# Patient Record
Sex: Female | Born: 1945
Health system: Southern US, Community
[De-identification: ages and names within clinical notes are randomized; demographics above are authoritative.]

## PROBLEM LIST (undated history)

## (undated) DIAGNOSIS — D122 Benign neoplasm of ascending colon: Secondary | ICD-10-CM

## (undated) DIAGNOSIS — F32A Depression, unspecified: Secondary | ICD-10-CM

## (undated) DIAGNOSIS — E785 Hyperlipidemia, unspecified: Secondary | ICD-10-CM

## (undated) DIAGNOSIS — F329 Major depressive disorder, single episode, unspecified: Secondary | ICD-10-CM

## (undated) DIAGNOSIS — G4700 Insomnia, unspecified: Secondary | ICD-10-CM

## (undated) DIAGNOSIS — N951 Menopausal and female climacteric states: Secondary | ICD-10-CM

## (undated) DIAGNOSIS — I10 Essential (primary) hypertension: Secondary | ICD-10-CM

## (undated) DIAGNOSIS — J449 Chronic obstructive pulmonary disease, unspecified: Secondary | ICD-10-CM

## (undated) DIAGNOSIS — J309 Allergic rhinitis, unspecified: Secondary | ICD-10-CM

## (undated) DIAGNOSIS — M1990 Unspecified osteoarthritis, unspecified site: Secondary | ICD-10-CM

## (undated) DIAGNOSIS — R51 Headache: Secondary | ICD-10-CM

## (undated) DIAGNOSIS — R569 Unspecified convulsions: Secondary | ICD-10-CM

## (undated) DIAGNOSIS — K219 Gastro-esophageal reflux disease without esophagitis: Secondary | ICD-10-CM

## (undated) DIAGNOSIS — N819 Female genital prolapse, unspecified: Secondary | ICD-10-CM

## (undated) DIAGNOSIS — E039 Hypothyroidism, unspecified: Secondary | ICD-10-CM

## (undated) DIAGNOSIS — R519 Headache, unspecified: Secondary | ICD-10-CM

## (undated) DIAGNOSIS — M810 Age-related osteoporosis without current pathological fracture: Secondary | ICD-10-CM

## (undated) DIAGNOSIS — J00 Acute nasopharyngitis [common cold]: Secondary | ICD-10-CM

## (undated) HISTORY — PX: EYE SURGERY: SHX253

## (undated) HISTORY — PX: OTHER SURGICAL HISTORY: SHX169

---

## 2002-05-08 HISTORY — PX: MULTIPLE TOOTH EXTRACTIONS: SHX2053

## 2004-05-05 ENCOUNTER — Ambulatory Visit: Payer: Self-pay | Admitting: Unknown Physician Specialty

## 2005-12-12 ENCOUNTER — Ambulatory Visit: Payer: Self-pay | Admitting: Internal Medicine

## 2007-12-24 ENCOUNTER — Ambulatory Visit: Payer: Self-pay | Admitting: Internal Medicine

## 2009-04-09 ENCOUNTER — Ambulatory Visit: Payer: Self-pay | Admitting: Internal Medicine

## 2010-07-18 ENCOUNTER — Ambulatory Visit: Payer: Self-pay | Admitting: Ophthalmology

## 2011-05-09 HISTORY — PX: CATARACT EXTRACTION W/ INTRAOCULAR LENS IMPLANT: SHX1309

## 2012-06-05 ENCOUNTER — Other Ambulatory Visit: Payer: Self-pay | Admitting: Urology

## 2012-07-09 ENCOUNTER — Encounter (HOSPITAL_BASED_OUTPATIENT_CLINIC_OR_DEPARTMENT_OTHER): Payer: Self-pay | Admitting: *Deleted

## 2012-07-09 NOTE — Progress Notes (Signed)
NPO AFTER MN. ARRIVES AT 0730. NEEDS HG. REVIEWED [facility] GUIDELINES.

## 2012-07-14 NOTE — Anesthesia Preprocedure Evaluation (Addendum)
Anesthesia Evaluation  Patient identified by MRN, date of birth, ID band Patient awake    Reviewed: Allergy & Precautions, H&P , NPO status , Patient's Chart, lab work & pertinent test results, reviewed documented beta blocker date and time   Airway Mallampati: II TM Distance: >3 FB Neck ROM: full    Dental  (+) Upper Dentures, Loose and Dental Advisory Given Loose lower front teeth.:   Pulmonary Current Smoker,  breath sounds clear to auscultation  Pulmonary exam normal       Cardiovascular Exercise Tolerance: Good negative cardio ROS  Rhythm:regular Rate:Normal     Neuro/Psych negative neurological ROS  negative psych ROS   GI/Hepatic negative GI ROS, Neg liver ROS,   Endo/Other  negative endocrine ROS  Renal/GU negative Renal ROS  negative genitourinary   Musculoskeletal   Abdominal   Peds  Hematology negative hematology ROS (+)   Anesthesia Other Findings   Reproductive/Obstetrics negative OB ROS                          Anesthesia Physical Anesthesia Plan  ASA: II  Anesthesia Plan: General LMA   Post-op Pain Management:    Induction:   Airway Management Planned:   Additional Equipment:   Intra-op Plan:   Post-operative Plan:   Informed Consent: I have reviewed the patients History and Physical, chart, labs and discussed the procedure including the risks, benefits and alternatives for the proposed anesthesia with the patient or authorized representative who has indicated his/her understanding and acceptance.   Dental Advisory Given  Plan Discussed with: CRNA  Anesthesia Plan Comments:         Anesthesia Quick Evaluation

## 2012-07-15 ENCOUNTER — Encounter (HOSPITAL_BASED_OUTPATIENT_CLINIC_OR_DEPARTMENT_OTHER): Payer: Self-pay | Admitting: Anesthesiology

## 2012-07-15 ENCOUNTER — Encounter (HOSPITAL_BASED_OUTPATIENT_CLINIC_OR_DEPARTMENT_OTHER)
Admission: RE | Disposition: A | Discharge status: Home / Self Care | Payer: Self-pay | Source: Ambulatory Visit | Attending: Urology

## 2012-07-15 ENCOUNTER — Ambulatory Visit (HOSPITAL_BASED_OUTPATIENT_CLINIC_OR_DEPARTMENT_OTHER)
Admission: RE | Admit: 2012-07-15 | Discharge: 2012-07-16 | Disposition: A | Discharge status: Home / Self Care | Payer: Medicare Other | Source: Ambulatory Visit | Attending: Urology | Admitting: Urology

## 2012-07-15 ENCOUNTER — Ambulatory Visit (HOSPITAL_BASED_OUTPATIENT_CLINIC_OR_DEPARTMENT_OTHER): Payer: Medicare Other | Admitting: Anesthesiology

## 2012-07-15 DIAGNOSIS — E7800 Pure hypercholesterolemia, unspecified: Secondary | ICD-10-CM | POA: Insufficient documentation

## 2012-07-15 DIAGNOSIS — N8111 Cystocele, midline: Secondary | ICD-10-CM | POA: Insufficient documentation

## 2012-07-15 DIAGNOSIS — F17200 Nicotine dependence, unspecified, uncomplicated: Secondary | ICD-10-CM | POA: Insufficient documentation

## 2012-07-15 DIAGNOSIS — J4489 Other specified chronic obstructive pulmonary disease: Secondary | ICD-10-CM | POA: Insufficient documentation

## 2012-07-15 DIAGNOSIS — Z7982 Long term (current) use of aspirin: Secondary | ICD-10-CM | POA: Insufficient documentation

## 2012-07-15 DIAGNOSIS — K5900 Constipation, unspecified: Secondary | ICD-10-CM | POA: Insufficient documentation

## 2012-07-15 DIAGNOSIS — N3946 Mixed incontinence: Secondary | ICD-10-CM | POA: Insufficient documentation

## 2012-07-15 DIAGNOSIS — Z79899 Other long term (current) drug therapy: Secondary | ICD-10-CM | POA: Insufficient documentation

## 2012-07-15 HISTORY — DX: Acute nasopharyngitis (common cold): J00

## 2012-07-15 HISTORY — PX: CYSTOSCOPY: SHX5120

## 2012-07-15 HISTORY — DX: Insomnia, unspecified: G47.00

## 2012-07-15 HISTORY — DX: Female genital prolapse, unspecified: N81.9

## 2012-07-15 HISTORY — DX: Hyperlipidemia, unspecified: E78.5

## 2012-07-15 HISTORY — PX: PUBOVAGINAL SLING: SHX1035

## 2012-07-15 LAB — CREATININE, SERUM
GFR calc Af Amer: >90 mL/min (ref >90)
GFR calc non Af Amer: 88 mL/min — ABNORMAL LOW (ref >90)

## 2012-07-15 LAB — CBC
HCT: 39.2 % (ref 36.0–46.0)
Hemoglobin: 13.2 g/dL (ref 12.0–15.0)
MCHC: 33.7 g/dL (ref 30.0–36.0)

## 2012-07-15 SURGERY — CREATION, PUBOVAGINAL SLING
Anesthesia: General | Site: Vagina | Wound class: Clean Contaminated

## 2012-07-15 MED ORDER — DIPHENHYDRAMINE HCL 12.5 MG/5ML PO ELIX
12.5000 mg | ORAL_SOLUTION | Freq: Four times a day (QID) | ORAL | Status: DC | PRN
  Filled 2012-07-15: qty 5

## 2012-07-15 MED ORDER — OXYCODONE-ACETAMINOPHEN 5-325 MG PO TABS: 1.0000 | ORAL_TABLET | ORAL | Status: DC | PRN

## 2012-07-15 MED ORDER — SODIUM CHLORIDE 0.9 % IJ SOLN
INTRAMUSCULAR | Status: DC | PRN
  Administered 2012-07-15: 50 mL via INTRAVENOUS

## 2012-07-15 MED ORDER — BUPIVACAINE-EPINEPHRINE 0.5% -1:200000 IJ SOLN
INTRAMUSCULAR | Status: DC | PRN
  Administered 2012-07-15: 30 mL

## 2012-07-15 MED ORDER — DIPHENHYDRAMINE HCL 50 MG/ML IJ SOLN
12.5000 mg | Freq: Four times a day (QID) | INTRAMUSCULAR | Status: DC | PRN
  Filled 2012-07-15: qty 0.25

## 2012-07-15 MED ORDER — ACETAMINOPHEN 10 MG/ML IV SOLN
1000.0000 mg | Freq: Once | INTRAVENOUS | Status: DC
  Filled 2012-07-15: qty 100

## 2012-07-15 MED ORDER — SIMVASTATIN 5 MG PO TABS
5.0000 mg | ORAL_TABLET | Freq: Every day | ORAL | Status: DC
  Administered 2012-07-15: 5 mg via ORAL
  Filled 2012-07-15: qty 1

## 2012-07-15 MED ORDER — OXYBUTYNIN CHLORIDE 5 MG PO TABS
5.0000 mg | ORAL_TABLET | Freq: Three times a day (TID) | ORAL | Status: DC | PRN
  Filled 2012-07-15: qty 1

## 2012-07-15 MED ORDER — HYDROCODONE-ACETAMINOPHEN 5-325 MG PO TABS
1.0000 | ORAL_TABLET | ORAL | Status: DC | PRN
  Administered 2012-07-15 (×2): 1 via ORAL
  Filled 2012-07-15: qty 2

## 2012-07-15 MED ORDER — EPHEDRINE SULFATE 50 MG/ML IJ SOLN
INTRAMUSCULAR | Status: DC | PRN
  Administered 2012-07-15: 15 mg via INTRAVENOUS
  Administered 2012-07-15: 10 mg via INTRAVENOUS
  Administered 2012-07-15: 15 mg via INTRAVENOUS
  Administered 2012-07-15: 10 mg via INTRAVENOUS

## 2012-07-15 MED ORDER — ONDANSETRON HCL 4 MG/2ML IJ SOLN
INTRAMUSCULAR | Status: DC | PRN
  Administered 2012-07-15: 4 mg via INTRAVENOUS

## 2012-07-15 MED ORDER — POLYETHYLENE GLYCOL 3350 17 G PO PACK: PACK | ORAL | Status: DC

## 2012-07-15 MED ORDER — DOXYLAMINE SUCCINATE (SLEEP) 25 MG PO TABS
25.0000 mg | ORAL_TABLET | Freq: Every evening | ORAL | Status: DC | PRN
  Filled 2012-07-15: qty 1

## 2012-07-15 MED ORDER — PROPOFOL 10 MG/ML IV BOLUS
INTRAVENOUS | Status: DC | PRN
  Administered 2012-07-15: 150 mg via INTRAVENOUS
  Administered 2012-07-15: 20 mg via INTRAVENOUS

## 2012-07-15 MED ORDER — FENTANYL CITRATE 0.05 MG/ML IJ SOLN
INTRAMUSCULAR | Status: DC | PRN
  Administered 2012-07-15: 25 ug via INTRAVENOUS
  Administered 2012-07-15 (×2): 50 ug via INTRAVENOUS

## 2012-07-15 MED ORDER — INDIGOTINDISULFONATE SODIUM 8 MG/ML IJ SOLN
INTRAMUSCULAR | Status: DC | PRN
  Administered 2012-07-15: 5 mL via INTRAVENOUS

## 2012-07-15 MED ORDER — PROMETHAZINE HCL 25 MG/ML IJ SOLN
6.2500 mg | INTRAMUSCULAR | Status: DC | PRN
  Administered 2012-07-15: 6.25 mg via INTRAVENOUS
  Filled 2012-07-15: qty 1

## 2012-07-15 MED ORDER — CEFAZOLIN SODIUM-DEXTROSE 2-3 GM-% IV SOLR
2.0000 g | INTRAVENOUS | Status: AC
  Administered 2012-07-15: 2 g via INTRAVENOUS
  Filled 2012-07-15: qty 50

## 2012-07-15 MED ORDER — METOCLOPRAMIDE HCL 5 MG/ML IJ SOLN
INTRAMUSCULAR | Status: DC | PRN
  Administered 2012-07-15: 10 mg via INTRAVENOUS

## 2012-07-15 MED ORDER — ONDANSETRON HCL 4 MG/2ML IJ SOLN
4.0000 mg | INTRAMUSCULAR | Status: DC | PRN
  Filled 2012-07-15: qty 2

## 2012-07-15 MED ORDER — POLYETHYLENE GLYCOL 3350 17 GM/SCOOP PO POWD
1.0000 | Freq: Once | ORAL | Status: AC
  Administered 2012-07-15: 1 via ORAL
  Filled 2012-07-15: qty 255

## 2012-07-15 MED ORDER — DEXTROSE-NACL 5-0.45 % IV SOLN
INTRAVENOUS | Status: DC
  Administered 2012-07-15: 13:00:00 via INTRAVENOUS
  Filled 2012-07-15: qty 1000

## 2012-07-15 MED ORDER — MIDAZOLAM HCL 5 MG/5ML IJ SOLN
INTRAMUSCULAR | Status: DC | PRN
  Administered 2012-07-15: 1 mg via INTRAVENOUS

## 2012-07-15 MED ORDER — LIDOCAINE HCL (CARDIAC) 20 MG/ML IV SOLN
INTRAVENOUS | Status: DC | PRN
  Administered 2012-07-15: 60 mg via INTRAVENOUS

## 2012-07-15 MED ORDER — SODIUM CHLORIDE 0.9 % IR SOLN
Status: DC | PRN
  Administered 2012-07-15: 09:00:00

## 2012-07-15 MED ORDER — BELLADONNA ALKALOIDS-OPIUM 16.2-60 MG RE SUPP
RECTAL | Status: DC | PRN
  Administered 2012-07-15: 1 via RECTAL

## 2012-07-15 MED ORDER — ENOXAPARIN SODIUM 40 MG/0.4ML ~~LOC~~ SOLN
40.0000 mg | SUBCUTANEOUS | Status: DC
  Administered 2012-07-15: 40 mg via SUBCUTANEOUS
  Filled 2012-07-15: qty 0.4

## 2012-07-15 MED ORDER — BISACODYL 10 MG RE SUPP
10.0000 mg | Freq: Every day | RECTAL | Status: DC | PRN
  Filled 2012-07-15: qty 1

## 2012-07-15 MED ORDER — CIPROFLOXACIN HCL 500 MG PO TABS
500.0000 mg | ORAL_TABLET | Freq: Two times a day (BID) | ORAL | Status: DC
  Administered 2012-07-15 – 2012-07-16 (×2): 500 mg via ORAL
  Filled 2012-07-15: qty 1

## 2012-07-15 MED ORDER — DEXAMETHASONE SODIUM PHOSPHATE 4 MG/ML IJ SOLN
INTRAMUSCULAR | Status: DC | PRN
  Administered 2012-07-15: 10 mg via INTRAVENOUS

## 2012-07-15 MED ORDER — HYDROMORPHONE HCL PF 1 MG/ML IJ SOLN
0.5000 mg | INTRAMUSCULAR | Status: DC | PRN
  Filled 2012-07-15: qty 1

## 2012-07-15 MED ORDER — ACETAMINOPHEN 10 MG/ML IV SOLN
1000.0000 mg | Freq: Four times a day (QID) | INTRAVENOUS | Status: DC
  Administered 2012-07-15: 1000 mg via INTRAVENOUS
  Filled 2012-07-15: qty 100

## 2012-07-15 MED ORDER — STERILE WATER FOR IRRIGATION IR SOLN
Status: DC | PRN
  Administered 2012-07-15: 3000 mL

## 2012-07-15 MED ORDER — KETOROLAC TROMETHAMINE 30 MG/ML IJ SOLN
INTRAMUSCULAR | Status: DC | PRN
  Administered 2012-07-15: 30 mg via INTRAVENOUS

## 2012-07-15 MED ORDER — CIPROFLOXACIN HCL 500 MG PO TABS: 500.0000 mg | ORAL_TABLET | Freq: Two times a day (BID) | ORAL | Status: DC

## 2012-07-15 MED ORDER — METRONIDAZOLE 0.75 % VA GEL
VAGINAL | Status: DC | PRN
  Administered 2012-07-15: 1 via VAGINAL

## 2012-07-15 MED ORDER — FENTANYL CITRATE 0.05 MG/ML IJ SOLN
25.0000 ug | INTRAMUSCULAR | Status: DC | PRN
  Administered 2012-07-15: 25 ug via INTRAVENOUS
  Filled 2012-07-15: qty 1

## 2012-07-15 MED ORDER — LACTATED RINGERS IV SOLN
INTRAVENOUS | Status: DC
  Administered 2012-07-15: 100 mL/h via INTRAVENOUS
  Administered 2012-07-15: 10:00:00 via INTRAVENOUS
  Filled 2012-07-15: qty 1000

## 2012-07-15 SURGICAL SUPPLY — 62 items
BAG URINE DRAINAGE (UROLOGICAL SUPPLIES) ×3 IMPLANT
BLADE SURG 10 STRL SS (BLADE) ×3 IMPLANT
BLADE SURG 15 STRL LF DISP TIS (BLADE) ×2 IMPLANT
BLADE SURG 15 STRL SS (BLADE) ×1
BLADE SURG ROTATE 9660 (MISCELLANEOUS) ×3 IMPLANT
BOOTIES KNEE HIGH SLOAN (MISCELLANEOUS) ×3 IMPLANT
CANISTER SUCTION 1200CC (MISCELLANEOUS) IMPLANT
CANISTER SUCTION 2500CC (MISCELLANEOUS) ×6 IMPLANT
CATH FOLEY 2WAY SLVR  5CC 16FR (CATHETERS) ×1
CATH FOLEY 2WAY SLVR 5CC 16FR (CATHETERS) ×2 IMPLANT
CLOTH BEACON ORANGE TIMEOUT ST (SAFETY) ×3 IMPLANT
COVER MAYO STAND STRL (DRAPES) ×3 IMPLANT
COVER TABLE BACK 60X90 (DRAPES) ×3 IMPLANT
DERMABOND ADVANCED (GAUZE/BANDAGES/DRESSINGS)
DERMABOND ADVANCED .7 DNX12 (GAUZE/BANDAGES/DRESSINGS) IMPLANT
DEVICE CAPIO SLIM BOX (INSTRUMENTS) IMPLANT
DISSECTOR ROUND CHERRY 3/8 STR (MISCELLANEOUS) IMPLANT
DRAPE CAMERA CLOSED 9X96 (DRAPES) ×3 IMPLANT
DRAPE UNDERBUTTOCKS STRL (DRAPE) ×3 IMPLANT
FLOSEAL 10ML (HEMOSTASIS) IMPLANT
GAUZE SPONGE 4X4 16PLY XRAY LF (GAUZE/BANDAGES/DRESSINGS) IMPLANT
GLOVE BIO SURGEON STRL SZ 6 (GLOVE) ×3 IMPLANT
GLOVE BIO SURGEON STRL SZ 6.5 (GLOVE) ×3 IMPLANT
GLOVE BIO SURGEON STRL SZ7.5 (GLOVE) ×6 IMPLANT
GOWN PREVENTION PLUS LG XLONG (DISPOSABLE) ×3 IMPLANT
GOWN STRL REIN XL XLG (GOWN DISPOSABLE) ×3 IMPLANT
HOOK RETRACTION 12 ELAST STAY (MISCELLANEOUS) IMPLANT
NEEDLE 1/2 CIR CATGUT .05X1.09 (NEEDLE) IMPLANT
NEEDLE HYPO 22GX1.5 SAFETY (NEEDLE) ×6 IMPLANT
PACKING VAGINAL (PACKING) ×3 IMPLANT
PENCIL BUTTON HOLSTER BLD 10FT (ELECTRODE) ×3 IMPLANT
PLUG CATH AND CAP STER (CATHETERS) ×3 IMPLANT
RETRACTOR LONRSTAR 16.6X16.6CM (MISCELLANEOUS) ×2 IMPLANT
RETRACTOR STAY HOOK 5MM (MISCELLANEOUS) ×3 IMPLANT
RETRACTOR STER APS 16.6X16.6CM (MISCELLANEOUS) ×3
SET IRRIG Y TYPE TUR BLADDER L (SET/KITS/TRAYS/PACK) ×3 IMPLANT
SHEET LAVH (DRAPES) ×3 IMPLANT
SLING SOLYX SYSTEM SIS BX (SLING) IMPLANT
SLING SOLYX SYSTEM SIS EA (Sling) ×3 IMPLANT
SPONGE LAP 4X18 X RAY DECT (DISPOSABLE) ×3 IMPLANT
SUCTION FRAZIER TIP 10 FR DISP (SUCTIONS) ×3 IMPLANT
SUT ABS MONO DBL WITH NDL 48IN (SUTURE) IMPLANT
SUT ETHILON 2 0 PS N (SUTURE) IMPLANT
SUT MON AB 2-0 SH 27 (SUTURE)
SUT MON AB 2-0 SH27 (SUTURE) IMPLANT
SUT MON AB 3-0 SH 27 (SUTURE) ×2
SUT MON AB 3-0 SH27 (SUTURE) ×4 IMPLANT
SUT NONABSORB MONO DB W/NDL 48 (SUTURE) IMPLANT
SUT PDS AB 3-0 SH 27 (SUTURE) IMPLANT
SUT SILK 3 0 PS 1 (SUTURE) IMPLANT
SUT VIC AB 0 CT1 36 (SUTURE) IMPLANT
SUT VIC AB 2-0 CT1 27 (SUTURE)
SUT VIC AB 2-0 CT1 TAPERPNT 27 (SUTURE) IMPLANT
SUT VIC AB 2-0 UR6 27 (SUTURE) ×12 IMPLANT
SYR BULB IRRIGATION 50ML (SYRINGE) ×3 IMPLANT
SYR CONTROL 10ML LL (SYRINGE) ×3 IMPLANT
SYRINGE 10CC LL (SYRINGE) ×3 IMPLANT
SYSTEM VAGINAL SUPPORT UPHOLD (Sling) ×3 IMPLANT
TRAY DSU PREP LF (CUSTOM PROCEDURE TRAY) ×3 IMPLANT
TUBE CONNECTING 12X1/4 (SUCTIONS) ×6 IMPLANT
WATER STERILE IRR 500ML POUR (IV SOLUTION) ×6 IMPLANT
YANKAUER SUCT BULB TIP NO VENT (SUCTIONS) IMPLANT

## 2012-07-15 NOTE — Anesthesia Procedure Notes (Signed)
Procedure Name: LMA Insertion Date/Time: 07/15/2012 8:56 AM Performed by: Norva Pavlov Pre-anesthesia Checklist: Patient identified, Emergency Drugs available, Suction available and Patient being monitored Patient Re-evaluated:Patient Re-evaluated prior to inductionOxygen Delivery Method: Circle System Utilized Preoxygenation: Pre-oxygenation with 100% oxygen Intubation Type: IV induction Ventilation: Mask ventilation without difficulty LMA: LMA inserted LMA Size: 4.0 Number of attempts: 1 Airway Equipment and Method: bite block Placement Confirmation: positive ETCO2 Tube secured with: Tape Dental Injury: Teeth and Oropharynx as per pre-operative assessment

## 2012-07-15 NOTE — OR Nursing (Signed)
Dr. Patsi Sears by to see. Up and ambulated in hallway, 2 rounds and tolerated well.

## 2012-07-15 NOTE — Op Note (Signed)
Pre-operative diagnosis : Grade 3, stage III anterior vaginal vault prolapse with coincidental stress urinary incontinence  Postoperative diagnosis:  Same  Operation:  Anterior vaginal vault prolapse repair with Kelly plication, sacrospinous ligament fixation with [employer] uphold light mesh augmentation repair; sole X. transurethral sling, cystoscopy  Surgeon:  S. Patsi Sears, MD  First assistant: None    Anesthesia:  general  Preparation:  After appropriate preanesthesia, the patient was brought the operating room, placed on the operating table in the dorsal supine position where general LMA anesthesia was introduced. The armband was double checked. The patient was placed in the dorsal lithotomy position where the pubis was prepped with Betadine solution, and draped in usual fashion. Gen. vaginal examination revealed grade 3, stage III anterior vault prolapse, with the bladder at the level of the vaginal introitus. There was no enterocele or rectocele identified. The patient was noted to have hypermobile urethra stress urinary continence by history.   Review history:   yo female refered by Dr. Isabel Caprice for consultation to discuss pelvic floor repair. She is P-2-2-0, normal vaginal deliveries. No hysterectomy. Tobacco: 40 yrs x 1 ppd. No family history. BMI 25.6.  She presented with symptoms of anterior vaginal vault prolapse, x 6 months, as well as some mixed incontinence for 1-2 months; and at least some degree of obstructive symptoms. She was noted to have a grade 3-4 anterior midline cystocele. It was difficult to determine if there was a component of uterine prolapse. Posterior support appeared to be more reasonable.  Urodynamics on 05/10/12 revealed good bladder emptying, with a negligible postvoid residual. The patient was noted to have marked increased bladder hypersensitivity with a substantially reduced functional bladder capacity. She did not have demonstrated stress urinary  incontinence, with or without her prolapse reduced, although her Valsalva and cough efforts were poor. Pressure flow analysis was difficult, due to her reduced capacity- and fairly significant problems with bladder instability. She was placed on Myrbetriq for 2 weeks, with no difference in her symptoms. Sodas: Dt. Pepsi, 4/day. Coffee: 4-10/day.  Clinically, as well as based on urodynamics, Ms. Kavanagh clearly has symptomatic prolapse. This is primarily cystocele. She clearly has significant voiding dysfunction at least probably in part due for her prolapse. No definitive stress urinary incontinence but clear evidence of bladder instability/ hyperactivity.  Originally presented with complaints of vaginal prolapse/pressure. She was referred by her granddaughter who works at [facility]. She has no prior urologic history and does not appear to have had any significant gynecologic surgery. For 6+ months, she has had a clear feeling of prolapse. She reports manually pushing her "bladder" back up. This is especially problematic if she is doing more physical activity on her feet or coughing. This does bother her a fair amount. In addition, she does have what sounds like some mixed incontinence, urge greater than stress. Coughing usually results in increased prolapse, but not a lot of leakage. She does have urgency issues with some urge incontinence. She does have some mild hesitancy and a weak stream at times.      Statement of  Likelihood of Success: Excellent. TIME-OUT observed.:  Procedure:  Using a blue marking pen, and after the foley catheter was placed,  the bladder neck was marked. A 2 cm midline area was marked for Solyx. sling insertion at the mid urethra, and a 5 cm horizontal incision mark is made 1 cm proximal to the urethrovaginal junction. 70 cc of mixture of Marcaine 0.25% with epinephrine 1:2,000, mixed with saline  solution, was injected into the proposed incision site.  A 5 cm horizontal  incision is then made, 1 cm proximal to the urethrovaginal junction. Subcutaneous tissue is dissected with blunt and sharp dissection. Dissection is carried into the pelvic floor space. The ischial spines are identified bilaterally, and the sacrospinous ligaments are identified bilaterally and dissected.  A small Kelly plication is accomplished using 3-0 Monocryl suture with horizontal mattress sutures placed.  Using the [facility] device, 2 separate sutures are then placed in the sacrospinous ligaments, one finger breath medial to the ischial spines bilaterally. The uphold light mesh is then placed bilaterally, and sutured proximally around the cervix with 3-0 Vicryl, and sutured distally with 3-0 Vicryl suture. The limbs of the mesh are then brought through the sacrospinous ligament, and the plastic sleeves are cut, and removed in standard fashion. The wound is irrigated with antibiotic irrigation. No bleeding is noted.  Following this, closure is accomplished with 3-0 Monocryl suture, with interrupted and running suture technique. No vaginal mucosa is removed.  Cystourethroscopy was accomplished after indigo carmine was given, and jets of blue dye were seen from both orifices. The urethra was quite tight, and required urethral dilation to a size 28 to allow cystoscopy.   Attention was then directed to the mid urethra, where 10 cc of Marcaine and epinephrine solution was injected in standard fashion. A 2 cm incision is made over the mid urethra and subcutaneous tissue is dissected bilaterally. The Solyx sling is then placed in standard fashion, first on the right side, into the obturator internus muscle, across the midline, which was previously marked with a blue marking pen, and then into the left-sided obturator internus muscle. Direct vision was accomplished showing "pillowing" of the tissue through the sling. The wound is closed with running and interrupted 3-0 Monocryl suture.  Vaginal packing is  placed. The patient is given IV Toradol, and is awakened, and taken to recovery room in good condition.

## 2012-07-15 NOTE — Interval H&P Note (Signed)
History and Physical Interval Note:  07/15/2012 8:48 AM  Karen Moon  has presented today for surgery, with the diagnosis of ANTERIOR VAULT PROLAPSE  The various methods of treatment have been discussed with the patient and family. After consideration of risks, benefits and other options for treatment, the patient has consented to  Procedure(s) with comments: PUBO-VAGINAL SLING (N/A) - UPHOLD LITE ANTERIOR VAULT REPAIR SOLYX SLING as a surgical intervention .  The patient's history has been reviewed, patient examined, no change in status, stable for surgery.  I have reviewed the patient's chart and labs.  Questions were answered to the patient's satisfaction.   Continue Miralax 2 tbsp q day.  Jethro Bolus I

## 2012-07-15 NOTE — OR Nursing (Signed)
Ambulated in halls with minimal assistance, tolerated well.

## 2012-07-15 NOTE — OR Nursing (Signed)
Tolerated sitting on side with slight lightheadedness. Became more lightheaded with ambulation. Back to bed.

## 2012-07-15 NOTE — Anesthesia Postprocedure Evaluation (Signed)
  Anesthesia Post-op Note  Patient: Karen Moon  Procedure(s) Performed: Procedure(s) (LRB): PUBO-VAGINAL SLING (N/A) CYSTOSCOPY (N/A)  Patient Location: PACU  Anesthesia Type: General  Level of Consciousness: awake and alert   Airway and Oxygen Therapy: Patient Spontanous Breathing  Post-op Pain: mild  Post-op Assessment: Post-op Vital signs reviewed, Patient's Cardiovascular Status Stable, Respiratory Function Stable, Patent Airway and No signs of Nausea or vomiting  Last Vitals:  Filed Vitals:   07/15/12 1045  BP: 108/57  Pulse: 67  Temp: 36.4 C  Resp: 10    Post-op Vital Signs: stable   Complications: No apparent anesthesia complications

## 2012-07-15 NOTE — H&P (Signed)
ef Complaint   cc: Dr. Einar Crow, [facility] cc: Dr. Barron Alvine   Reason For Visit  Consult   Active Problems Problems  1. Urge And Stress Incontinence 788.33 2. Vaginal Wall Prolapse With Midline Cystocele 618.01  History of Present Illness              67 yo female refered by Dr. Isabel Caprice for consultation to discuss pelvic floor repair. She is P-2-2-0, normal vaginal deliveries. No hysterectomy. Tobacco: 40 yrs x 1 ppd. No family history. BMI 25.6.   She presented with symptoms of anterior vaginal vault prolapse, x 6 months,  as well as some mixed incontinence for 1-2 months;  and at least some degree of obstructive symptoms. She was noted to have a grade 3-4 anterior midline cystocele. It was difficult to determine if there was a component of uterine prolapse. Posterior support appeared to be more reasonable.  Urodynamics on 05/10/12 revealed good bladder emptying,  with a negligible postvoid residual. The patient was noted to have marked increased bladder hypersensitivity with a substantially reduced functional bladder capacity. She did not have demonstrated stress urinary incontinence,  with or without her prolapse reduced,  although her Valsalva and cough efforts were poor. Pressure flow analysis was difficult,  due to her reduced capacity- and fairly significant problems with bladder instability. She was placed on Myrbetriq for 2 weeks, with no difference in her symptoms. Sodas: Dt. Pepsi, 4/day. Coffee: 4-10/day.      Clinically,  as well as based on urodynamics,  Ms. Rodriguez clearly has symptomatic prolapse. This is primarily cystocele.  She clearly has significant voiding dysfunction at least probably in part due for her prolapse. No definitive stress urinary incontinence but clear evidence of bladder instability/ hyperactivity.      Originally presented with complaints of vaginal prolapse/pressure.  She was referred by her granddaughter who works at [facility].  She has  no prior urologic history and does not appear to have had any significant gynecologic surgery.  For 6+ months, she has had a clear feeling of prolapse.  She reports manually pushing her "bladder" back up.  This is especially problematic if she is doing more physical activity on her feet or coughing.  This does bother her a fair amount.  In addition, she does have what sounds like some mixed incontinence, urge greater than stress.  Coughing usually results in increased prolapse, but not a lot of leakage.  She does have urgency issues with some urge incontinence.  She does have some mild hesitancy and a weak stream at times.   Past Medical History Problems  1. History of  Arthritis V13.4 2. History of  Chronic Obstructive Pulmonary Disease 496 3. History of  Depression 311 4. History of  Hypercholesterolemia 272.0 5. History of  Seizure  Surgical History Problems  1. History of  Cataract Surgery  Current Meds 1. Adult Aspirin Low Strength 81 MG Oral Tablet Dispersible; Therapy: (Recorded:05Dec2013) to 2. Clobetasol Propionate E 0.05 % External Cream; Therapy: (Recorded:05Dec2013) to 3. Pravastatin Sodium 40 MG Oral Tablet; Therapy: (Recorded:05Dec2013) to 4. Vitamins/Minerals TABS; Therapy: (Recorded:05Dec2013) to  Allergies Medication  1. No Known Drug Allergies  Family History Problems  1. Paternal history of  Death In The Family Father 2. Maternal history of  Family Health Status - Mother's Age 57. Family history of  Family Health Status Number Of Children 4. Paternal history of  Gastric Ulcer  Social History Problems    Caffeine Use   Occupation:  Tobacco Use 305.1 Denied    History of  Alcohol Use  Review of Systems Genitourinary, constitutional, skin, eye, otolaryngeal, hematologic/lymphatic, cardiovascular, pulmonary, endocrine, musculoskeletal, gastrointestinal, neurological and psychiatric system(s) were reviewed and pertinent findings if present are noted.   Genitourinary: incontinence.    Vitals Vital Signs [Data Includes: Last 1 Day]  27Jan2014 04:09PM  Blood Pressure: 96 / 61 Temperature: 98.4 F Heart Rate: 71  Physical Exam Constitutional: Well nourished and well developed . No acute distress.  ENT:. The ears and nose are normal in appearance.  Neck: The appearance of the neck is normal and no neck mass is present.  Pulmonary: No respiratory distress and normal respiratory rhythm and effort.  Cardiovascular: Heart rate and rhythm are normal . No peripheral edema.  Abdomen: The abdomen is mildly obese. The abdomen is soft and nontender. No masses are palpated. No CVA tenderness. No hernias are palpable. No hepatosplenomegaly noted. Genitourinary:. Hypermobile urethra, Negative Marshall test, + Q-Tip test.  Chaperone Present: kim lewis.  Examination of the external genitalia shows normal female external genitalia, no lesions and no labial adhesions. The urethra is not tender, does not appear stenotic and no urethral caruncle. There is no urethral mass. Urethral hypermobility is present. There is no urethral discharge. No periurethral cyst is identified. There is no urethral prolapse. Vaginal exam demonstrates the vaginal epithelium to be poorly estrogenized, but no atrophy, no discharge, no tenderness and no uterine prolapse. A cystocele is present with a midline defect (grade 3 /4). No enterocele is identified. No rectocele is identified. The cervix is is without abnormalities. There is no cervical discharge. There is no cervical motion tenderness The uterus is without abnormalities. The adnexa are palpably normal. The bladder is normal on palpation and not distended. The anus is normal on inspection. The rectum is not tender. The perineum is normal on inspection.  Lymphatics: The femoral and inguinal nodes are not enlarged or tender.  Skin: Normal skin turgor, no visible rash and no visible skin lesions.  Neuro/Psych:. Mood and affect are  appropriate.    Procedure  Gaynell Face test:  PVR - 75cc and instilled 300cc of sterile water.     Assessment Assessed  1. Vaginal Wall Prolapse With Midline Cystocele 618.01 2. Urge And Stress Incontinence 788.33 3. 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 She has a significant tobacco history, and has a + Q-tip test with hyperbobile urethra, although she does not leak for [facility] test. She has not been sexuually active since her husband died.  She has chronic constipation, treated with daily Miralax. She has a hx of nausea with anesthesia.  We have discussed her history, her anatomy, and the pathophysiology of pelvic floor repair. We have reviewed options for repair, including native tissue repair, biologic tissue repair, and repair with inert mesh, which wil be tacked to her sacrospinus ligaments ( beside her spine), to give her a hammock through which scar tissue will grow and give her permanent support. In addition, I have given her option for being counted in the pelvic floor study. However, she doesn't drive, and has to get a ride each time she comes in for the study follow-up.  She does not want to be counted in the study.    She will have BS UPHOLD LITE anterior vault repair and Solyx sling.   Plan Urge And Stress Incontinence (788.33)  1. Gaynell Face Test  Done: 27Jan2014  1. Schedule surgery. UPHOLD and SOLYX.   Signatures

## 2012-07-15 NOTE — Transfer of Care (Signed)
Immediate Anesthesia Transfer of Care Note  Patient: Karen Moon  Procedure(s) Performed: Procedure(s) (LRB): PUBO-VAGINAL SLING (N/A) CYSTOSCOPY (N/A)  Patient Location: PACU  Anesthesia Type: General  Level of Consciousness:drowsy  Airway & Oxygen Therapy: Patient Spontanous Breathing and Patient connected to face mask oxygen  Post-op Assessment: Report given to PACU RN and Post -op Vital signs reviewed and stable  Post vital signs: Reviewed and stable  Complications: No apparent anesthesia complications

## 2012-07-15 NOTE — Progress Notes (Signed)
Urology Progress Note  Day of Surgery   Subjective: post op Anterior vault sacrospinus vault suspension and Solyx sling.Pt with lightheadedness while walking this afternoon-normal now. Urine blue-green from indigo-carmine. Reports mild back pain  Now gone.      No acute urologic events overnight. Ambulation:   positive Flatus:    negative Bowel movement  negative  Pain: complete resolution  Objective:  Blood pressure 97/53, pulse 79, temperature 97 F (36.1 C), temperature source Oral, resp. rate 18, height 5' (1.524 m), weight 59.875 kg (132 lb), SpO2 96.00%.  Physical Exam:  General:  No acute distress, awake Resp: clear to auscultation bilaterally Extremities: extremities normal, atraumatic, no cyanosis or edema Genitourinary:  Packing in place Foley: in place       Recent Labs     07/15/12  0823  07/15/12  1348  HGB  15.4*  13.2  WBC   --   14.3*  PLT   --   270    Recent Labs     07/15/12  1348  CREATININE  0.73  GFRNONAA  88*  GFRAA  >90     No results found for this basename: PT, INR, APTT,  in the last 72 hours   No components found with this basename: ABG,   Assessment/Plan:  Catheter not removed. Continue any current medications. Pt is to increase activities as tolerated. Foley out as planned in AM. Packin out as planned in AM.

## 2012-07-16 ENCOUNTER — Encounter (HOSPITAL_BASED_OUTPATIENT_CLINIC_OR_DEPARTMENT_OTHER): Payer: Self-pay | Admitting: Urology

## 2012-07-16 MED ORDER — FAMOTIDINE IN NACL 20-0.9 MG/50ML-% IV SOLN
20.0000 mg | Freq: Two times a day (BID) | INTRAVENOUS | Status: DC
  Filled 2012-07-16: qty 50

## 2012-12-09 ENCOUNTER — Ambulatory Visit: Payer: Self-pay | Admitting: Internal Medicine

## 2013-11-15 DIAGNOSIS — M810 Age-related osteoporosis without current pathological fracture: Secondary | ICD-10-CM | POA: Insufficient documentation

## 2013-12-23 ENCOUNTER — Ambulatory Visit: Payer: Self-pay | Admitting: Internal Medicine

## 2014-12-07 ENCOUNTER — Other Ambulatory Visit: Payer: Self-pay | Admitting: Family Medicine

## 2014-12-07 ENCOUNTER — Telehealth: Payer: Self-pay | Admitting: Family Medicine

## 2014-12-07 DIAGNOSIS — Z13220 Encounter for screening for lipoid disorders: Secondary | ICD-10-CM

## 2014-12-07 DIAGNOSIS — R5383 Other fatigue: Secondary | ICD-10-CM

## 2014-12-07 DIAGNOSIS — Z113 Encounter for screening for infections with a predominantly sexual mode of transmission: Secondary | ICD-10-CM | POA: Insufficient documentation

## 2014-12-07 NOTE — Telephone Encounter (Signed)
Let patient know lab work slip ready to pick up and have lab work drawn fasting at least 8hrs. Will discuss results at upcoming visit.

## 2014-12-07 NOTE — Telephone Encounter (Signed)
Pt is requesting lab slip for labs

## 2014-12-07 NOTE — Telephone Encounter (Signed)
PT HAS BEEN NOTIFIED.

## 2014-12-08 DIAGNOSIS — Z13220 Encounter for screening for lipoid disorders: Secondary | ICD-10-CM | POA: Diagnosis not present

## 2014-12-08 DIAGNOSIS — Z113 Encounter for screening for infections with a predominantly sexual mode of transmission: Secondary | ICD-10-CM | POA: Diagnosis not present

## 2014-12-08 DIAGNOSIS — R5383 Other fatigue: Secondary | ICD-10-CM | POA: Diagnosis not present

## 2014-12-09 ENCOUNTER — Encounter: Payer: Self-pay | Admitting: Family Medicine

## 2014-12-09 DIAGNOSIS — E034 Atrophy of thyroid (acquired): Secondary | ICD-10-CM | POA: Insufficient documentation

## 2014-12-09 LAB — CBC WITH DIFFERENTIAL/PLATELET
Basophils Absolute: 0.1 10*3/uL (ref 0.0–0.2)
Basos: 1 %
EOS (ABSOLUTE): 0.7 10*3/uL — ABNORMAL HIGH (ref 0.0–0.4)
Eos: 11 %
HEMATOCRIT: 44.5 % (ref 34.0–46.6)
Hemoglobin: 14.9 g/dL (ref 11.1–15.9)
IMMATURE GRANULOCYTES: 0 %
Immature Grans (Abs): 0 10*3/uL (ref 0.0–0.1)
Lymphocytes Absolute: 1.9 10*3/uL (ref 0.7–3.1)
Lymphs: 30 %
MCH: 29.6 pg (ref 26.6–33.0)
MCHC: 33.5 g/dL (ref 31.5–35.7)
MCV: 88 fL (ref 79–97)
MONOS ABS: 0.3 10*3/uL (ref 0.1–0.9)
Monocytes: 5 %
NEUTROS ABS: 3.4 10*3/uL (ref 1.4–7.0)
Neutrophils: 53 %
Platelets: 326 10*3/uL (ref 150–379)
RBC: 5.04 x10E6/uL (ref 3.77–5.28)
RDW: 13.9 % (ref 12.3–15.4)
WBC: 6.4 10*3/uL (ref 3.4–10.8)

## 2014-12-09 LAB — COMPREHENSIVE METABOLIC PANEL
ALK PHOS: 49 IU/L (ref 39–117)
ALT: 14 IU/L (ref 0–32)
AST: 19 IU/L (ref 0–40)
Albumin/Globulin Ratio: 1.9 (ref 1.1–2.5)
Albumin: 4.3 g/dL (ref 3.6–4.8)
BILIRUBIN TOTAL: 0.5 mg/dL (ref 0.0–1.2)
BUN / CREAT RATIO: 13 (ref 11–26)
BUN: 12 mg/dL (ref 8–27)
CALCIUM: 9.9 mg/dL (ref 8.7–10.3)
CHLORIDE: 104 mmol/L (ref 97–108)
CO2: 23 mmol/L (ref 18–29)
Creatinine, Ser: 0.93 mg/dL (ref 0.57–1.00)
GFR calc Af Amer: 74 mL/min/{1.73_m2} (ref >59)
GFR calc non Af Amer: 64 mL/min/{1.73_m2} (ref >59)
GLUCOSE: 90 mg/dL (ref 65–99)
Globulin, Total: 2.3 g/dL (ref 1.5–4.5)
POTASSIUM: 4.6 mmol/L (ref 3.5–5.2)
Sodium: 144 mmol/L (ref 134–144)
Total Protein: 6.6 g/dL (ref 6.0–8.5)

## 2014-12-09 LAB — LIPID PANEL
CHOL/HDL RATIO: 3.9 ratio (ref 0.0–4.4)
CHOLESTEROL TOTAL: 182 mg/dL (ref 100–199)
HDL: 47 mg/dL (ref >39)
LDL Calculated: 92 mg/dL (ref 0–99)
Triglycerides: 214 mg/dL — ABNORMAL HIGH (ref 0–149)
VLDL CHOLESTEROL CAL: 43 mg/dL — AB (ref 5–40)

## 2014-12-09 LAB — TSH: TSH: 10.06 u[IU]/mL — ABNORMAL HIGH (ref 0.450–4.500)

## 2014-12-09 LAB — HIV ANTIBODY (ROUTINE TESTING W REFLEX): HIV SCREEN 4TH GENERATION: NONREACTIVE

## 2014-12-09 LAB — HEPATITIS C ANTIBODY: Hep C Virus Ab: <0.1 s/co ratio (ref 0.0–0.9)

## 2014-12-11 ENCOUNTER — Other Ambulatory Visit: Payer: Self-pay | Admitting: Family Medicine

## 2014-12-11 DIAGNOSIS — E034 Atrophy of thyroid (acquired): Secondary | ICD-10-CM

## 2014-12-16 DIAGNOSIS — E034 Atrophy of thyroid (acquired): Secondary | ICD-10-CM | POA: Diagnosis not present

## 2014-12-16 DIAGNOSIS — E038 Other specified hypothyroidism: Secondary | ICD-10-CM | POA: Diagnosis not present

## 2014-12-17 LAB — THYROGLOBULIN ANTIBODY: Thyroglobulin Antibody: <1 IU/mL (ref 0.0–0.9)

## 2014-12-17 LAB — TSH: TSH: 7.99 u[IU]/mL — AB (ref 0.450–4.500)

## 2014-12-17 LAB — THYROID PEROXIDASE ANTIBODY: Thyroperoxidase Ab SerPl-aCnc: 30 IU/mL (ref 0–34)

## 2014-12-17 LAB — T4, FREE: Free T4: 0.91 ng/dL (ref 0.82–1.77)

## 2014-12-17 LAB — T3, FREE: T3 FREE: 2.5 pg/mL (ref 2.0–4.4)

## 2014-12-21 LAB — THYROID STIMULATING IMMUNOGLOBULIN: TSI: 29 % (ref 0–139)

## 2014-12-29 ENCOUNTER — Ambulatory Visit (INDEPENDENT_AMBULATORY_CARE_PROVIDER_SITE_OTHER): Payer: Medicare Other | Admitting: Family Medicine

## 2014-12-29 ENCOUNTER — Encounter: Payer: Self-pay | Admitting: Family Medicine

## 2014-12-29 VITALS — BP 118/76 | HR 78 | Temp 98.3°F | Resp 16 | Wt 134.0 lb

## 2014-12-29 DIAGNOSIS — Z720 Tobacco use: Secondary | ICD-10-CM | POA: Diagnosis not present

## 2014-12-29 DIAGNOSIS — Z0000 Encounter for general adult medical examination without abnormal findings: Secondary | ICD-10-CM | POA: Diagnosis not present

## 2014-12-29 DIAGNOSIS — J449 Chronic obstructive pulmonary disease, unspecified: Secondary | ICD-10-CM | POA: Diagnosis not present

## 2014-12-29 DIAGNOSIS — Z23 Encounter for immunization: Secondary | ICD-10-CM

## 2014-12-29 DIAGNOSIS — Z13820 Encounter for screening for osteoporosis: Secondary | ICD-10-CM

## 2014-12-29 DIAGNOSIS — N951 Menopausal and female climacteric states: Secondary | ICD-10-CM | POA: Diagnosis not present

## 2014-12-29 DIAGNOSIS — E034 Atrophy of thyroid (acquired): Secondary | ICD-10-CM

## 2014-12-29 DIAGNOSIS — Z1231 Encounter for screening mammogram for malignant neoplasm of breast: Secondary | ICD-10-CM

## 2014-12-29 DIAGNOSIS — F32A Depression, unspecified: Secondary | ICD-10-CM | POA: Insufficient documentation

## 2014-12-29 DIAGNOSIS — F419 Anxiety disorder, unspecified: Secondary | ICD-10-CM

## 2014-12-29 DIAGNOSIS — E785 Hyperlipidemia, unspecified: Secondary | ICD-10-CM | POA: Diagnosis not present

## 2014-12-29 DIAGNOSIS — E038 Other specified hypothyroidism: Secondary | ICD-10-CM

## 2014-12-29 DIAGNOSIS — F5105 Insomnia due to other mental disorder: Secondary | ICD-10-CM

## 2014-12-29 DIAGNOSIS — Z716 Tobacco abuse counseling: Secondary | ICD-10-CM | POA: Diagnosis not present

## 2014-12-29 DIAGNOSIS — Z1211 Encounter for screening for malignant neoplasm of colon: Secondary | ICD-10-CM | POA: Insufficient documentation

## 2014-12-29 DIAGNOSIS — F409 Phobic anxiety disorder, unspecified: Secondary | ICD-10-CM | POA: Insufficient documentation

## 2014-12-29 DIAGNOSIS — F329 Major depressive disorder, single episode, unspecified: Secondary | ICD-10-CM | POA: Insufficient documentation

## 2014-12-29 MED ORDER — BUPROPION HCL ER (SR) 150 MG PO TB12: 150.0000 mg | ORAL_TABLET | Freq: Two times a day (BID) | ORAL | Status: DC

## 2014-12-29 MED ORDER — CLOBETASOL PROP EMOLLIENT BASE 0.05 % EX CREA: 1.0000 "application " | TOPICAL_CREAM | Freq: Two times a day (BID) | CUTANEOUS | Status: AC

## 2014-12-29 MED ORDER — LEVOTHYROXINE SODIUM 50 MCG PO TABS: 50.0000 ug | ORAL_TABLET | Freq: Every day | ORAL | Status: DC

## 2014-12-29 NOTE — Patient Instructions (Signed)

## 2014-12-29 NOTE — Progress Notes (Signed)
Name: Karen Moon   MRN: [id number]    DOB: 1946-01-26   Date:12/29/2014       Progress Note  Subjective  Chief Complaint  Chief Complaint  Patient presents with  . Annual Exam    also lab review  . Nicotine Dependence    patient uses "nic out"    HPI  Patient is here today for a Female Medicare Wellness Visit:  The patient has had stable chronic medical needs.  Recent blood work showed elevated TSH and triglyceride cholesterol panel. She was tested because of previous elevation currently on statin therapy. There is a family history of hyperlipidemia. There is not a family history of early ischemia heart disease.  [name] has previously been evaluated here for COPD exacerbation along with bronchitis and presents for an follow-up with no reported acute exacerbation symptoms today. Symptoms currently include dyspnea with exertion and occasional cough with scant white or yellow sputum. Observed precipitants include cigarette smoke, URI, dramatic change in weather temperature.  Current limitations in activity from COPD is prolonged walking or climbing stairs. Has not needed any maintenance or rescue inhalers in several months. Haydon also requests refill of her steroid topical cream that she uses in the external vaginal area for previous dryness and tearing of skin. In addition she reports interest in smoking cessation. She is down to 1/2 ppd of cigarettes but had an allergic dermatitis reaction to nicotine patch.  Has used wellbutrin in the past for mood disorders and it did not cause any intolerable side effects.   Past Medical History  Diagnosis Date  . Hyperlipemia   . Common cold   . Vaginal vault prolapse     ANTERIOR  . Insomnia     Past Surgical History  Procedure Laterality Date  . Multiple tooth extractions  2004    ORAL MD OFFICE  . Cataract extraction w/ intraocular lens implant Left 2013  . Pubovaginal sling N/A 07/15/2012    Procedure: [name];  Surgeon:  Ailene Rud, MD;  Location: [facility];  Service: Urology;  Laterality: N/A;  UPHOLD LITE ANTERIOR VAULT REPAIR SOLYX SLING  . Cystoscopy N/A 07/15/2012    Procedure: CYSTOSCOPY;  Surgeon: Ailene Rud, MD;  Location: [facility];  Service: Urology;  Laterality: N/A;    History reviewed. No pertinent family history.  Social History   Social History  . Marital Status: Married    Spouse Name: N/A  . Number of Children: N/A  . Years of Education: N/A   Occupational History  . Not on file.   Social History Main Topics  . Smoking status: Current Every Day Smoker -- 1.00 packs/day for 40 years    Types: Cigarettes  . Smokeless tobacco: Never Used  . Alcohol Use: No  . Drug Use: No  . Sexual Activity: No   Other Topics Concern  . Not on file   Social History Narrative     Current outpatient prescriptions:  .  aspirin EC 81 MG tablet, Take 1 tablet by mouth daily., Disp: , Rfl:  .  Biotin 1000 MCG tablet, Take 1 tablet by mouth daily., Disp: , Rfl:  .  Cholecalciferol (VITAMIN D3) 2000 UNITS capsule, Take 1 capsule by mouth daily., Disp: , Rfl:  .  Clobetasol Prop Emollient Base 0.05 % emollient cream, , Disp: , Rfl:  .  fluticasone (FLONASE) 50 MCG/ACT nasal spray, Place 2 sprays into the nose daily., Disp: , Rfl:  .  Multiple  Vitamins-Minerals (MULTIVITAL PLATINUM SILVER PO), Take 1 tablet by mouth daily., Disp: , Rfl:  .  Omega 3-6-9 CAPS, Take 1 capsule by mouth daily., Disp: , Rfl:  .  pravastatin (PRAVACHOL) 40 MG tablet, Take 1 tablet by mouth at bedtime., Disp: , Rfl:   No Known Allergies  Fall Risk: Fall Risk  12/29/2014  Falls in the past year? No    Depression screen PHQ 2/9 12/29/2014  Decreased Interest 0  Down, Depressed, Hopeless 0  PHQ - 2 Score 0   Functional Status Survey: Is the patient deaf or have difficulty hearing?: No Does the patient have difficulty seeing, even when wearing glasses/contacts?: Yes  (patient only wears store bought reading glasses) Does the patient have difficulty concentrating, remembering, or making decisions?: No Does the patient have difficulty walking or climbing stairs?: No Does the patient have difficulty dressing or bathing?: No Does the patient have difficulty doing errands alone such as visiting a doctor's office or shopping?: No   ROS  CONSTITUTIONAL: No significant weight changes, fever, chills, weakness or fatigue.  HEENT:  - Eyes: No visual changes.  - Ears: No auditory changes. No pain.  - Nose: No sneezing, congestion, runny nose. - Throat: No sore throat. No changes in swallowing. SKIN: No rash or itching.  CARDIOVASCULAR: No chest pain, chest pressure or chest discomfort. No palpitations or edema.  RESPIRATORY: No shortness of breath, cough or sputum.  GASTROINTESTINAL: No anorexia, nausea, vomiting. No changes in bowel habits. No abdominal pain or blood.  GENITOURINARY: No dysuria. No frequency. No discharge.  NEUROLOGICAL: No headache, dizziness, syncope, paralysis, ataxia, numbness or tingling in the extremities. No memory changes. No change in bowel or bladder control.  MUSCULOSKELETAL: No joint pain. No muscle pain. HEMATOLOGIC: No anemia, bleeding or bruising.  LYMPHATICS: No enlarged lymph nodes.  PSYCHIATRIC: No change in mood. No change in sleep pattern.  ENDOCRINOLOGIC: No reports of sweating, cold or heat intolerance. No polyuria or polydipsia.   Objective  Filed Vitals:   12/29/14 1045  BP: 118/76  Pulse: 78  Temp: 98.3 F (36.8 C)  TempSrc: Oral  Resp: 16  Weight: 134 lb (60.782 kg)  SpO2: 96%   Body mass index is 26.17 kg/(m^2).  Physical Exam  Constitutional: Patient appears well-developed and well-nourished. In no distress.  HEENT:  - Head: Normocephalic and atraumatic.  - Ears: Bilateral TMs gray, no erythema or effusion - Nose: Nasal mucosa moist - Mouth/Throat: Oropharynx is clear and moist. No tonsillar  hypertrophy or erythema. No post nasal drainage. Top teeth dentures. - Eyes: Conjunctivae clear, EOM movements normal. PERRLA. No scleral icterus.  Neck: Normal range of motion. Neck supple. No JVD present. No thyromegaly present.  Cardiovascular: Normal rate, regular rhythm and normal heart sounds.  No murmur heard.  Pulmonary/Chest: Effort normal and breath sounds mildly decreased in all lung fields. No respiratory distress. Breast: Bilateral breast tissue and axilla with no masses, skin dimpling or nipple discharge. Musculoskeletal: Normal range of motion bilateral UE and LE, no joint effusions. Peripheral vascular: Bilateral LE no edema. Neurological: CN II-XII grossly intact with no focal deficits. Alert and oriented to person, place, and time. Coordination, balance, strength, speech and gait are normal.  Skin: Skin is warm and dry. No rash noted. No erythema.  Psychiatric: Patient has a normal mood and affect. Behavior is normal in office today. Judgment and thought content normal in office today.   Assessment & Plan  1. Medicare annual wellness visit, subsequent  Functional ability/safety  issues: No Issues Hearing issues: Addressed  Activities of daily living: Discussed Home safety issues: No Issues  End Of Life Planning: Offered verbal information regarding advanced directives, healthcare power of attorney.  Preventative care, Health maintenance, Preventative health measures discussed.  Preventative screenings discussed today: lab work, colonoscopy, PAP, mammogram, DEXA.  Low Dose CT Chest recommended if Age 52-80 years, 30 pack-year currently smoking OR have quit w/in 15years.   Lifestyle risk factor issued reviewed: Diet, exercise, weight management, advised patient smoking is not healthy, nutrition/diet.  Preventative health measures discussed (5-10 year plan).  Reviewed and recommended vaccinations: - Pneumovax  - Prevnar  - Annual Influenza - Zostavax - Tdap    Depression screening: Done Fall risk screening: Done Discuss ADLs/IADLs: Done  Current medical providers: See HPI  Other health risk factors identified this visit: No other issues Cognitive impairment issues: None identified  All above discussed with patient. Appropriate education, counseling and referral will be made based upon the above.     2. Hypothyroidism due to acquired atrophy of thyroid Reviewed recent lab work with patient along with treatment options. Decided on starting Levothyroxine 50 mcg, instructed to take alone with water in the am with no other medications or food for 30 mins. The patient has been counseled on the proper use, side effects and potential interactions of the new medication. Patient encouraged to review the side effects and safety profile pamphlet provided with the prescription from the pharmacy as well as request counseling from the pharmacy team as needed.   - levothyroxine (SYNTHROID) 50 MCG tablet; Take 1 tablet (50 mcg total) by mouth daily before breakfast.  Dispense: 90 tablet; Refill: 1  3. Encounter for screening mammogram for malignant neoplasm of breast  - MM Digital Screening; Future  4. Encounter for screening for malignant neoplasm of colon  - Ambulatory referral to General Surgery  5. COPD, mild Clinically stable findings based on clinical exam and on review of any pertinent results. Recommended to patient that they continue their current regimen with regular follow ups.   6. Hyperlipidemia LDL goal <100 Continue statin therapy along with dietary changes to reduce hypertriglyceridemia.   7. Need for immunization against influenza  - Flu Vaccine QUAD 36+ mos PF IM (Fluarix & Fluzone Quad PF)  8. Vaginal dryness, menopausal Refilled.  - Clobetasol Prop Emollient Base 0.05 % emollient cream; Apply 1 application topically 2 (two) times daily.  Dispense: 15 g; Refill: 1  9. Encounter for smoking cessation counseling The patient  has been counseled on smoking cessation benefits, goals, strategies and available over the counter and prescription medications that may help them in their efforts.  Options discussed include Nicoderm patches, Wellbutrin and Chantix.  The patient voices understanding their increased risk of cardiovascular and pulmonary diseases with continued use of tobacco products.  - buPROPion (WELLBUTRIN SR) 150 MG 12 hr tablet; Take 1 tablet (150 mg total) by mouth 2 (two) times daily.  Dispense: 180 tablet; Refill: 0  10. Encounter for screening for osteoporosis  - DG Bone Density; Future

## 2014-12-31 ENCOUNTER — Ambulatory Visit
Admission: RE | Admit: 2014-12-31 | Discharge: 2014-12-31 | Disposition: A | Discharge status: Home / Self Care | Payer: Medicare Other | Source: Ambulatory Visit | Attending: Family Medicine | Admitting: Family Medicine

## 2014-12-31 ENCOUNTER — Encounter: Payer: Self-pay | Admitting: Family Medicine

## 2014-12-31 ENCOUNTER — Other Ambulatory Visit: Payer: Self-pay | Admitting: Family Medicine

## 2014-12-31 DIAGNOSIS — Z13820 Encounter for screening for osteoporosis: Secondary | ICD-10-CM | POA: Insufficient documentation

## 2014-12-31 DIAGNOSIS — M810 Age-related osteoporosis without current pathological fracture: Secondary | ICD-10-CM | POA: Diagnosis not present

## 2014-12-31 DIAGNOSIS — Z1231 Encounter for screening mammogram for malignant neoplasm of breast: Secondary | ICD-10-CM

## 2014-12-31 MED ORDER — ALENDRONATE SODIUM 70 MG PO TABS: 70.0000 mg | ORAL_TABLET | ORAL | Status: DC

## 2014-12-31 NOTE — Progress Notes (Signed)
Sent in Rx to [facility]

## 2015-01-05 ENCOUNTER — Other Ambulatory Visit: Payer: Self-pay

## 2015-01-05 ENCOUNTER — Telehealth: Payer: Self-pay | Admitting: Gastroenterology

## 2015-01-05 NOTE — Telephone Encounter (Signed)
Gastroenterology Pre-Procedure Review  Request Date:  Requesting Physician: Dr. Nadine Counts  PATIENT REVIEW QUESTIONS: The patient responded to the following health history questions as indicated:    1. Are you having any GI issues? no 2. Do you have a personal history of Polyps? yes ( ) 3. Do you have a family history of Colon Cancer or Polyps? no cancer not sure about polyps 4. Diabetes Mellitus? no 5. Joint replacements in the past 12 months?no 6. Major health problems in the past 3 months?no 7. Any artificial heart valves, MVP, or defibrillator?no    MEDICATIONS & ALLERGIES:    Patient reports the following regarding taking any anticoagulation/antiplatelet therapy:   Plavix, Coumadin, Eliquis, Xarelto, Lovenox, Pradaxa, Brilinta, or Effient? no Aspirin? yes (Daily)  Patient confirms/reports the following medications:  Current Outpatient Prescriptions  Medication Sig Dispense Refill   alendronate (FOSAMAX) 70 MG tablet Take 1 tablet (70 mg total) by mouth every 7 (seven) days. Take with a full glass of water on an empty stomach. 12 tablet 3   aspirin EC 81 MG tablet Take 1 tablet by mouth daily.     Biotin 1000 MCG tablet Take 1 tablet by mouth daily.     buPROPion (WELLBUTRIN SR) 150 MG 12 hr tablet Take 1 tablet (150 mg total) by mouth 2 (two) times daily. 180 tablet 0   Cholecalciferol (VITAMIN D3) 2000 UNITS capsule Take 1 capsule by mouth daily.     Clobetasol Prop Emollient Base 0.05 % emollient cream Apply 1 application topically 2 (two) times daily. 15 g 1   fluticasone (FLONASE) 50 MCG/ACT nasal spray Place 2 sprays into the nose daily.     levothyroxine (SYNTHROID) 50 MCG tablet Take 1 tablet (50 mcg total) by mouth daily before breakfast. 90 tablet 1   Multiple Vitamins-Minerals (MULTIVITAL PLATINUM SILVER PO) Take 1 tablet by mouth daily.     Omega 3-6-9 CAPS Take 1 capsule by mouth daily.     pravastatin (PRAVACHOL) 40 MG tablet Take 1 tablet by mouth at  bedtime.     No current facility-administered medications for this visit.    Patient confirms/reports the following allergies:  No Known Allergies  No orders of the defined types were placed in this encounter.    AUTHORIZATION INFORMATION Primary Insurance: 1D#: Group #:  Secondary Insurance: 1D#: Group #:  SCHEDULE INFORMATION: Date: 01-26-2015 Time: Location:[facility]

## 2015-01-13 ENCOUNTER — Telehealth: Payer: Self-pay | Admitting: Family Medicine

## 2015-01-13 NOTE — Telephone Encounter (Signed)
Allegra is okay to take along with her other medications she is on. Also continue flonase nasal spray.

## 2015-01-13 NOTE — Telephone Encounter (Signed)
Patient informed and states that the flonase did not help. She used steam instead. Also, patient took her first pill for her bones today and have noticed a shooting pain in her leg and a few in her hip. Wondering if it was due to the medication. It has not been a all day thing. The pain does not run up and down her leg its just in spots and then it stops. Please advice.

## 2015-01-13 NOTE — Telephone Encounter (Signed)
Patient allergies have flared up. She normally take allegra allergy 24hr (fexofenadine hci) and would like to know if it is okay for her to take it along with her other medication due it having antihistamine in it.  (h) [phone] (c) [phone]

## 2015-01-14 NOTE — Telephone Encounter (Signed)
Patient was informed of Dr. Allie Dimmer message below and was encouraged to come in for an office visit if her symptoms worsens.

## 2015-01-14 NOTE — Telephone Encounter (Signed)
The alendronate is once a week dosing, continue taking it for at least 1 month and keep note of if there is a correlation to the leg/hip pain with taking the medication. If there is a correlation we can discuss alternative methods after 1 month.

## 2015-01-25 ENCOUNTER — Other Ambulatory Visit: Payer: Self-pay

## 2015-01-25 NOTE — Telephone Encounter (Signed)
Refill request was sent to Dr. Ashany Sundaram for approval and submission.  

## 2015-01-26 ENCOUNTER — Ambulatory Visit: Payer: Medicare Other | Admitting: Anesthesiology

## 2015-01-26 ENCOUNTER — Encounter
Admission: RE | Disposition: A | Discharge status: Home / Self Care | Payer: Self-pay | Source: Ambulatory Visit | Attending: Gastroenterology

## 2015-01-26 ENCOUNTER — Ambulatory Visit
Admission: RE | Admit: 2015-01-26 | Discharge: 2015-01-26 | Disposition: A | Discharge status: Home / Self Care | Payer: Medicare Other | Source: Ambulatory Visit | Attending: Gastroenterology | Admitting: Gastroenterology

## 2015-01-26 ENCOUNTER — Encounter: Payer: Self-pay | Admitting: *Deleted

## 2015-01-26 DIAGNOSIS — Z1211 Encounter for screening for malignant neoplasm of colon: Secondary | ICD-10-CM | POA: Insufficient documentation

## 2015-01-26 DIAGNOSIS — G4700 Insomnia, unspecified: Secondary | ICD-10-CM | POA: Diagnosis not present

## 2015-01-26 DIAGNOSIS — Z86010 Personal history of colonic polyps: Secondary | ICD-10-CM | POA: Diagnosis not present

## 2015-01-26 DIAGNOSIS — K635 Polyp of colon: Secondary | ICD-10-CM | POA: Diagnosis not present

## 2015-01-26 DIAGNOSIS — D122 Benign neoplasm of ascending colon: Secondary | ICD-10-CM | POA: Diagnosis not present

## 2015-01-26 DIAGNOSIS — E785 Hyperlipidemia, unspecified: Secondary | ICD-10-CM | POA: Diagnosis not present

## 2015-01-26 DIAGNOSIS — F17210 Nicotine dependence, cigarettes, uncomplicated: Secondary | ICD-10-CM | POA: Insufficient documentation

## 2015-01-26 DIAGNOSIS — Z803 Family history of malignant neoplasm of breast: Secondary | ICD-10-CM | POA: Insufficient documentation

## 2015-01-26 DIAGNOSIS — Z7982 Long term (current) use of aspirin: Secondary | ICD-10-CM | POA: Insufficient documentation

## 2015-01-26 DIAGNOSIS — E039 Hypothyroidism, unspecified: Secondary | ICD-10-CM | POA: Diagnosis not present

## 2015-01-26 DIAGNOSIS — J449 Chronic obstructive pulmonary disease, unspecified: Secondary | ICD-10-CM | POA: Diagnosis not present

## 2015-01-26 DIAGNOSIS — Z79899 Other long term (current) drug therapy: Secondary | ICD-10-CM | POA: Insufficient documentation

## 2015-01-26 HISTORY — DX: Hypothyroidism, unspecified: E03.9

## 2015-01-26 HISTORY — PX: COLONOSCOPY WITH PROPOFOL: SHX5780

## 2015-01-26 HISTORY — DX: Age-related osteoporosis without current pathological fracture: M81.0

## 2015-01-26 SURGERY — COLONOSCOPY WITH PROPOFOL
Anesthesia: General

## 2015-01-26 MED ORDER — SODIUM CHLORIDE 0.9 % IV SOLN
INTRAVENOUS | Status: DC
Start: 2015-01-26 — End: 2015-01-26
  Administered 2015-01-26 (×2): via INTRAVENOUS

## 2015-01-26 MED ORDER — GLYCOPYRROLATE 0.2 MG/ML IJ SOLN
INTRAMUSCULAR | Status: DC | PRN
  Administered 2015-01-26: 0.2 mg via INTRAVENOUS

## 2015-01-26 MED ORDER — PROPOFOL INFUSION 10 MG/ML OPTIME
INTRAVENOUS | Status: DC | PRN
Start: 2015-01-26 — End: 2015-01-26
  Administered 2015-01-26: 140 ug/kg/min via INTRAVENOUS

## 2015-01-26 MED ORDER — LIDOCAINE HCL (CARDIAC) 20 MG/ML IV SOLN
INTRAVENOUS | Status: DC | PRN
  Administered 2015-01-26: 60 mg via INTRAVENOUS

## 2015-01-26 MED ORDER — PROPOFOL 10 MG/ML IV BOLUS
INTRAVENOUS | Status: DC | PRN
  Administered 2015-01-26: 40 mg via INTRAVENOUS

## 2015-01-26 MED ORDER — PRAVASTATIN SODIUM 40 MG PO TABS: 40.0000 mg | ORAL_TABLET | Freq: Every day | ORAL | Status: DC

## 2015-01-26 MED ORDER — EPHEDRINE SULFATE 50 MG/ML IJ SOLN
INTRAMUSCULAR | Status: DC | PRN
  Administered 2015-01-26: 10 mg via INTRAVENOUS

## 2015-01-26 NOTE — Transfer of Care (Signed)
Immediate Anesthesia Transfer of Care Note  Patient: Karen Moon  Procedure(s) Performed: Procedure(s): COLONOSCOPY WITH PROPOFOL (N/A)  Patient Location: Endoscopy Unit  Anesthesia Type:General  Level of Consciousness: awake, alert , oriented and patient cooperative  Airway & Oxygen Therapy: Patient Spontanous Breathing and Patient connected to nasal cannula oxygen  Post-op Assessment: Report given to RN, Post -op Vital signs reviewed and stable and Patient moving all extremities X 4  Post vital signs: Reviewed and stable  Last Vitals:  Filed Vitals:   01/26/15 0915  BP: 114/49  Pulse: 72  Temp: 36.3 C  Resp: 14    Complications: No apparent anesthesia complications

## 2015-01-26 NOTE — Anesthesia Postprocedure Evaluation (Signed)
  Anesthesia Post-op Note  Patient: Karen Moon  Procedure(s) Performed: Procedure(s): COLONOSCOPY WITH PROPOFOL (N/A)  Anesthesia type:General  Patient location: PACU  Post pain: Pain level controlled  Post assessment: Post-op Vital signs reviewed, Patient's Cardiovascular Status Stable, Respiratory Function Stable, Patent Airway and No signs of Nausea or vomiting  Post vital signs: Reviewed and stable  Last Vitals:  Filed Vitals:   01/26/15 0940  BP: 121/77  Pulse: 74  Temp:   Resp: 13    Level of consciousness: awake, alert  and patient cooperative  Complications: No apparent anesthesia complications

## 2015-01-26 NOTE — Op Note (Signed)
[facility] Gastroenterology Patient Name: Karen Moon Procedure Date: 01/26/2015 8:25 AM MRN: [id number] Account #: [id number] Date of Birth: 1946-03-09 Admit Type: Outpatient Age: 69 Room: [facility] ENDO ROOM 4 Gender: Female Note Status: Finalized Procedure:         Colonoscopy Indications:       Screening for colorectal malignant neoplasm Providers:         Lucilla Lame, MD Referring MD:      Bobetta Lime (Referring MD) Medicines:         Propofol per Anesthesia Complications:     No immediate complications. Procedure:         Pre-Anesthesia Assessment:                    - Prior to the procedure, a History and Physical was                     performed, and patient medications and allergies were                     reviewed. The patient's tolerance of previous anesthesia                     was also reviewed. The risks and benefits of the procedure                     and the sedation options and risks were discussed with the                     patient. All questions were answered, and informed consent                     was obtained. Prior Anticoagulants: The patient has taken                     no previous anticoagulant or antiplatelet agents. ASA                     Grade Assessment: II - A patient with mild systemic                     disease. After reviewing the risks and benefits, the                     patient was deemed in satisfactory condition to undergo                     the procedure.                    After obtaining informed consent, the colonoscope was                     passed under direct vision. Throughout the procedure, the                     patient's blood pressure, pulse, and oxygen saturations                     were monitored continuously. The Olympus CF-H180AL                     colonoscope ( S#: [id number] ) was introduced through the  anus and advanced to the the cecum, identified by   appendiceal orifice and ileocecal valve. The colonoscopy                     was performed without difficulty. The patient tolerated                     the procedure well. The quality of the bowel preparation                     was excellent. Findings:      The perianal and digital rectal examinations were normal.      A 10 mm polyp was found in the ascending colon. The polyp was sessile.       Area was successfully injected with saline for a lift polypectomy. The       polyp was removed with a hot snare. Resection and retrieval were       complete. Impression:        - One 10 mm polyp in the ascending colon. Resected and                     retrieved. Injected. Recommendation:    - Await pathology results. Procedure Code(s): --- Professional ---                    [phone], Colonoscopy, flexible; with removal of tumor(s),                     polyp(s), or other lesion(s) by snare technique                    [id number], Colonoscopy, flexible; with directed submucosal                     injection(s), any substance Diagnosis Code(s): --- Professional ---                    Z12.11, Encounter for screening for malignant neoplasm of                     colon                    D12.2, Benign neoplasm of ascending colon CPT copyright 2014 [employer] Association. All rights reserved. The codes documented in this report are preliminary and upon coder review may  be revised to meet current compliance requirements. Lucilla Lame, MD 01/26/2015 9:09:38 AM This report has been signed electronically. Number of Addenda: 0 Note Initiated On: 01/26/2015 8:25 AM Scope Withdrawal Time: 0 hours 11 minutes 19 seconds  Total Procedure Duration: 0 hours 16 minutes 5 seconds       [facility]

## 2015-01-26 NOTE — Anesthesia Preprocedure Evaluation (Addendum)
Anesthesia Evaluation  Patient identified by MRN, date of birth, ID band Patient awake    Reviewed: Allergy & Precautions, NPO status , Patient's Chart, lab work & pertinent test results, reviewed documented beta blocker date and time   Airway Mallampati: III  TM Distance: >3 FB     Dental  (+) Chipped, Upper Dentures, Missing   Pulmonary COPD, Current Smoker,           Cardiovascular      Neuro/Psych PSYCHIATRIC DISORDERS Depression    GI/Hepatic   Endo/Other  Hypothyroidism   Renal/GU      Musculoskeletal   Abdominal   Peds  Hematology   Anesthesia Other Findings   Reproductive/Obstetrics                           Anesthesia Physical Anesthesia Plan  ASA: II  Anesthesia Plan: General   Post-op Pain Management:    Induction: Intravenous  Airway Management Planned: Nasal Cannula  Additional Equipment:   Intra-op Plan:   Post-operative Plan:   Informed Consent: I have reviewed the patients History and Physical, chart, labs and discussed the procedure including the risks, benefits and alternatives for the proposed anesthesia with the patient or authorized representative who has indicated his/her understanding and acceptance.     Plan Discussed with: CRNA  Anesthesia Plan Comments:        Anesthesia Quick Evaluation

## 2015-01-26 NOTE — H&P (Addendum)
[facility]  [address] [place], [place] [address] Phone: [phone] Fax : [phone]  Primary Care Physician:  Bobetta Lime, MD Primary Gastroenterologist:  Dr. Allen Norris  Pre-Procedure History & Physical: HPI:  Karen Moon is a 69 y.o. female is here for an colonoscopy.   Past Medical History  Diagnosis Date  . Hyperlipemia   . Common cold   . Vaginal vault prolapse     ANTERIOR  . Insomnia   . Osteoporosis   . Hypothyroidism     Past Surgical History  Procedure Laterality Date  . Multiple tooth extractions  2004    ORAL MD OFFICE  . Cataract extraction w/ intraocular lens implant Left 2013  . Pubovaginal sling N/A 07/15/2012    Procedure: [name];  Surgeon: Ailene Rud, MD;  Location: [facility];  Service: Urology;  Laterality: N/A;  UPHOLD LITE ANTERIOR VAULT REPAIR SOLYX SLING  . Cystoscopy N/A 07/15/2012    Procedure: CYSTOSCOPY;  Surgeon: Ailene Rud, MD;  Location: [facility];  Service: Urology;  Laterality: N/A;    Prior to Admission medications   Medication Sig Start Date End Date Taking? Authorizing Jovanna Hodges  alendronate (FOSAMAX) 70 MG tablet Take 1 tablet (70 mg total) by mouth every 7 (seven) days. Take with a full glass of water on an empty stomach. 12/31/14  Yes Bobetta Lime, MD  aspirin EC 81 MG tablet Take 1 tablet by mouth daily.   Yes Historical Kymberli Wiegand, MD  Biotin 1000 MCG tablet Take 1 tablet by mouth daily.   Yes Historical Korrey Schleicher, MD  buPROPion (WELLBUTRIN SR) 150 MG 12 hr tablet Take 1 tablet (150 mg total) by mouth 2 (two) times daily. 12/29/14  Yes Bobetta Lime, MD  Cholecalciferol (VITAMIN D3) 2000 UNITS capsule Take 1 capsule by mouth daily.   Yes Historical Reather Steller, MD  Clobetasol Prop Emollient Base 0.05 % emollient cream Apply 1 application topically 2 (two) times daily. 12/29/14  Yes Bobetta Lime, MD  fluticasone (FLONASE) 50 MCG/ACT nasal  spray Place 2 sprays into the nose daily. 02/04/14  Yes Historical Jazell Rosenau, MD  levothyroxine (SYNTHROID) 50 MCG tablet Take 1 tablet (50 mcg total) by mouth daily before breakfast. 12/29/14  Yes Bobetta Lime, MD  Multiple Vitamins-Minerals (MULTIVITAL PLATINUM SILVER PO) Take 1 tablet by mouth daily.   Yes Historical Jonas Goh, MD  Omega 3-6-9 CAPS Take 1 capsule by mouth daily.   Yes Historical Bates Collington, MD  pravastatin (PRAVACHOL) 40 MG tablet Take 1 tablet by mouth at bedtime. 10/29/14  Yes Historical Trevian Hayashida, MD    Allergies as of 01/05/2015  . (No Known Allergies)    Family History  Problem Relation Age of Onset  . Breast cancer Neg Hx     Social History   Social History  . Marital Status: Married    Spouse Name: N/A  . Number of Children: N/A  . Years of Education: N/A   Occupational History  . Not on file.   Social History Main Topics  . Smoking status: Current Every Day Smoker -- 1.00 packs/day for 40 years    Types: Cigarettes  . Smokeless tobacco: Never Used  . Alcohol Use: No  . Drug Use: No  . Sexual Activity: No   Other Topics Concern  . Not on file   Social History Narrative    Review of Systems: See HPI, otherwise negative ROS  Physical Exam: BP 105/85 mmHg  Pulse 65  Temp(Src) 97.3 F (36.3  C)  Resp 18  Ht 5' (1.524 m)  Wt 131 lb (59.421 kg)  BMI 25.58 kg/m2  SpO2 100% General:   Alert,  pleasant and cooperative in NAD Head:  Normocephalic and atraumatic. Neck:  Supple; no masses or thyromegaly. Lungs:  Clear throughout to auscultation.    Heart:  Regular rate and rhythm. Abdomen:  Soft, nontender and nondistended. Normal bowel sounds, without guarding, and without rebound.   Neurologic:  Alert and  oriented x4;  grossly normal neurologically.  Impression/Plan: Karen Moon is here for an colonoscopy to be performed for screening.  Risks, benefits, limitations, and alternatives regarding  colonoscopy have been reviewed with the  patient.  Questions have been answered.  All parties agreeable.   Ollen Bowl, MD  01/26/2015, 8:01 AM

## 2015-01-27 ENCOUNTER — Encounter: Payer: Self-pay | Admitting: Gastroenterology

## 2015-01-27 LAB — SURGICAL PATHOLOGY

## 2015-03-04 ENCOUNTER — Other Ambulatory Visit: Payer: Self-pay | Admitting: Family Medicine

## 2015-03-05 ENCOUNTER — Other Ambulatory Visit: Payer: Self-pay | Admitting: Family Medicine

## 2015-03-05 MED ORDER — FLUTICASONE PROPIONATE 50 MCG/ACT NA SUSP: 2.0000 | Freq: Every day | NASAL | Status: DC

## 2015-03-05 NOTE — Telephone Encounter (Signed)
Refill request was sent to Dr. Ashany Sundaram for approval and submission.  

## 2015-03-05 NOTE — Telephone Encounter (Signed)
Requesting refill on Fluticasone 50 mcg. Please send to [facility].  (H) [phone] (C) [phone]

## 2015-03-08 ENCOUNTER — Telehealth: Payer: Self-pay | Admitting: Family Medicine

## 2015-03-08 ENCOUNTER — Other Ambulatory Visit: Payer: Self-pay | Admitting: Family Medicine

## 2015-03-08 DIAGNOSIS — M810 Age-related osteoporosis without current pathological fracture: Secondary | ICD-10-CM

## 2015-03-08 MED ORDER — RISEDRONATE SODIUM 35 MG PO TABS: 35.0000 mg | ORAL_TABLET | ORAL | Status: DC

## 2015-03-08 NOTE — Telephone Encounter (Signed)
Patient was informed and said thanks. 

## 2015-03-08 NOTE — Telephone Encounter (Signed)
Please speak with the Patient, she called after hours nursing line and reported Alendronate 75 mg one a week caused leg pain. Stopped medication. I want her to try Risedronate 35 mg one a week. New Rx sent to [facility] 30 days for her to try out first. If she has no side effects then I can send to mail order later.

## 2015-03-16 ENCOUNTER — Telehealth: Payer: Self-pay | Admitting: Family Medicine

## 2015-03-16 NOTE — Telephone Encounter (Signed)
Patient is wanting to know if there is an alternative to actonel that she can take now.

## 2015-03-18 NOTE — Telephone Encounter (Signed)
she called about this a few weeks ago too:  Alendronate 75 mg one a week caused leg pain. Stopped medication. I wanted her to try Risedronate 35 mg one a week. New Rx was sent to [facility] 30 days for her to try out first.   DID SHE NOT GET THIS MESSAGE FROM BEFORE? The Rx should be at [facility] waiting for her.

## 2015-03-18 NOTE — Telephone Encounter (Signed)
Patient was previously informed of this conversation on 03/08/15 @ 1:12pm and the new rx was sent in on 03/08/15 to [facility] on [address]

## 2015-03-19 ENCOUNTER — Other Ambulatory Visit: Payer: Self-pay | Admitting: Family Medicine

## 2015-03-29 NOTE — Telephone Encounter (Signed)
Patient was informed of Dr. Allie Dimmer message and stated that she had 2000mg  of Vitamin D and will purchased 1200mg  of Calcium. She then asked if she could double up and I told her to wait until she comes back in February to discuss this with Dr. Nadine Counts. She agreed.

## 2015-03-29 NOTE — Telephone Encounter (Signed)
The osteoporosis was prominent in her left hip area. She is welcome to use OTC calcium and vit D supplement but this will not reverse her osteoporosis unfortunately.

## 2015-03-29 NOTE — Telephone Encounter (Signed)
Patient wanted to know if she could take 1200 mg or more of calcium due her current medication, Risedronate costing to much.  She also wanted to know where is the osteoporosis is exactly in her body.

## 2015-03-30 ENCOUNTER — Other Ambulatory Visit: Payer: Self-pay | Admitting: Family Medicine

## 2015-05-12 DIAGNOSIS — D492 Neoplasm of unspecified behavior of bone, soft tissue, and skin: Secondary | ICD-10-CM | POA: Diagnosis not present

## 2015-05-12 DIAGNOSIS — L814 Other melanin hyperpigmentation: Secondary | ICD-10-CM | POA: Diagnosis not present

## 2015-05-12 DIAGNOSIS — L821 Other seborrheic keratosis: Secondary | ICD-10-CM | POA: Diagnosis not present

## 2015-05-12 DIAGNOSIS — D229 Melanocytic nevi, unspecified: Secondary | ICD-10-CM | POA: Diagnosis not present

## 2015-05-12 DIAGNOSIS — L820 Inflamed seborrheic keratosis: Secondary | ICD-10-CM | POA: Diagnosis not present

## 2015-06-28 ENCOUNTER — Telehealth: Payer: Self-pay | Admitting: Family Medicine

## 2015-06-28 DIAGNOSIS — E785 Hyperlipidemia, unspecified: Secondary | ICD-10-CM

## 2015-06-28 DIAGNOSIS — E034 Atrophy of thyroid (acquired): Secondary | ICD-10-CM

## 2015-06-28 DIAGNOSIS — R5383 Other fatigue: Secondary | ICD-10-CM

## 2015-06-28 NOTE — Telephone Encounter (Signed)
Patient has appointment for this coming Friday and would like to know if you want her to do lab work: if so she would need an order for triglyceride (cholesterol). Also if she does the blood work this week do you think she need to reschedule her appointment for next week so the results will be in?

## 2015-06-30 NOTE — Telephone Encounter (Signed)
Labs ordered, have them drawn fasting today or tomorrow and results should be in by Friday.

## 2015-06-30 NOTE — Telephone Encounter (Signed)
Patient states cannot come in to get blood work done now.  She will get Friday when she is here for her appointment.

## 2015-07-02 ENCOUNTER — Ambulatory Visit (INDEPENDENT_AMBULATORY_CARE_PROVIDER_SITE_OTHER): Payer: Medicare Other | Admitting: Family Medicine

## 2015-07-02 ENCOUNTER — Encounter: Payer: Self-pay | Admitting: Family Medicine

## 2015-07-02 VITALS — BP 122/64 | HR 74 | Temp 97.7°F | Resp 14 | Ht 60.0 in | Wt 137.0 lb

## 2015-07-02 DIAGNOSIS — E785 Hyperlipidemia, unspecified: Secondary | ICD-10-CM

## 2015-07-02 DIAGNOSIS — M810 Age-related osteoporosis without current pathological fracture: Secondary | ICD-10-CM | POA: Diagnosis not present

## 2015-07-02 DIAGNOSIS — R5383 Other fatigue: Secondary | ICD-10-CM | POA: Diagnosis not present

## 2015-07-02 DIAGNOSIS — E034 Atrophy of thyroid (acquired): Secondary | ICD-10-CM

## 2015-07-02 DIAGNOSIS — Z716 Tobacco abuse counseling: Secondary | ICD-10-CM | POA: Diagnosis not present

## 2015-07-02 DIAGNOSIS — Z720 Tobacco use: Secondary | ICD-10-CM

## 2015-07-02 DIAGNOSIS — E038 Other specified hypothyroidism: Secondary | ICD-10-CM

## 2015-07-02 DIAGNOSIS — J449 Chronic obstructive pulmonary disease, unspecified: Secondary | ICD-10-CM | POA: Diagnosis not present

## 2015-07-02 NOTE — Progress Notes (Signed)
Name: Karen Moon   MRN: [id number]    DOB: Sep 10, 1945   Date:07/02/2015       Progress Note  Subjective  Chief Complaint  Chief Complaint  Patient presents with  . Medication Refill    6 month f/u   . Hypothyroidism    hairloss  . Hyperlipidemia  . Depression    Taking wellbutrin to help stop smoking.  Only takes med when she feels the urge to smoke    HPI  Karen Moon is a 70 year old female here for routine follow up of medical conditions. Reports having success with smoking cessation efforts. Using wellbutrin prn cravings. Has had 5 cigarettes total in the past 4 months. Tried curbing cravings with hard candy but it was giving her GERD.   Hyperlipidemia: Patient presents with hyperlipidemia.  She was tested because co morbid conditions include , Hypothyroidism and HTN. There is a family history of hyperlipidemia. There is not a family history of early ischemia heart disease.  She continues to use her Pravastatin 40 mg one a day with no reported side effects.   Hypothyroidism: Patient presents for evaluation of thyroid function. Symptoms consist of fatigue, weight gain, change in skin,  nails, or hair. Symptoms have present for several years. The symptoms are transient.  The problem has been stable.  Previous thyroid studies include TSH, free thyroxine index and free thyroxine. The hypothyroidism is due to Hashimoto's disease.   COPD Follow-up: Mild COPD and reactive airway from smoking but improved since smoking cessation. Symptoms of exacerbation usually include dyspnea with exertion and occasional cough with scant white or yellow sputum. Observed precipitants include cigarette smoke, URI, dramatic change in weather temperature.  Current limitations in activity from COPD is none. Has not needed any maintenance or rescue inhalers in several months.   If you may recall Hero also has Osteoporosis but could not tolerate bisphosphonate therapy. Taking calcium and vit D  supplements.    Past Medical History  Diagnosis Date  . Hyperlipemia   . Common cold   . Vaginal vault prolapse     ANTERIOR  . Insomnia   . Osteoporosis   . Hypothyroidism     Patient Active Problem List   Diagnosis Date Noted  . Benign neoplasm of ascending colon   . Anxiety and depression 12/29/2014  . COPD, mild ([place]) 12/29/2014  . Hyperlipidemia LDL goal <100 12/29/2014  . Insomnia due to anxiety and fear 12/29/2014  . Vaginal dryness, menopausal 12/29/2014  . Hypothyroidism due to acquired atrophy of thyroid 12/09/2014  . Other fatigue 12/07/2014  . Osteoporosis 11/15/2013    Social History  Substance Use Topics  . Smoking status: Current Every Day Smoker -- 1.00 packs/day for 40 years    Types: Cigarettes  . Smokeless tobacco: Never Used  . Alcohol Use: No     Current outpatient prescriptions:  .  aspirin EC 81 MG tablet, Take 1 tablet by mouth daily., Disp: , Rfl:  .  Biotin 1000 MCG tablet, Take 1 tablet by mouth daily., Disp: , Rfl:  .  buPROPion (WELLBUTRIN SR) 150 MG 12 hr tablet, Take 1 tablet by mouth two  times daily, Disp: 180 tablet, Rfl: 2 .  calcium carbonate 1250 MG capsule, Take 1,250 mg by mouth 2 (two) times daily with a meal., Disp: , Rfl:  .  Cholecalciferol (VITAMIN D3) 2000 UNITS capsule, Take 1 capsule by mouth daily., Disp: , Rfl:  .  Clobetasol Prop Emollient Base 0.05 %  emollient cream, Apply 1 application topically 2 (two) times daily., Disp: 15 g, Rfl: 1 .  fluticasone (FLONASE) 50 MCG/ACT nasal spray, Place 2 sprays into both nostrils daily., Disp: 16 g, Rfl: 5 .  levothyroxine (SYNTHROID, LEVOTHROID) 50 MCG tablet, Take 1 tablet by mouth  daily before breakfast, Disp: 90 tablet, Rfl: 1 .  Multiple Vitamins-Minerals (MULTIVITAL PLATINUM SILVER PO), Take 1 tablet by mouth daily., Disp: , Rfl:  .  Omega 3-6-9 CAPS, Take 1 capsule by mouth daily., Disp: , Rfl:  .  pravastatin (PRAVACHOL) 40 MG tablet, Take 1 tablet by mouth at  bedtime,  Disp: 90 tablet, Rfl: 2  Past Surgical History  Procedure Laterality Date  . Multiple tooth extractions  2004    ORAL MD OFFICE  . Cataract extraction w/ intraocular lens implant Left 2013  . Pubovaginal sling N/A 07/15/2012    Procedure: [name];  Surgeon: Ailene Rud, MD;  Location: [facility];  Service: Urology;  Laterality: N/A;  UPHOLD LITE ANTERIOR VAULT REPAIR SOLYX SLING  . Cystoscopy N/A 07/15/2012    Procedure: CYSTOSCOPY;  Surgeon: Ailene Rud, MD;  Location: [facility];  Service: Urology;  Laterality: N/A;  . Colonoscopy with propofol N/A 01/26/2015    Procedure: COLONOSCOPY WITH PROPOFOL;  Surgeon: Lucilla Lame, MD;  Location: [facility];  Service: Endoscopy;  Laterality: N/A;    Family History  Problem Relation Age of Onset  . Breast cancer Neg Hx     No Known Allergies   Review of Systems  CONSTITUTIONAL: No significant weight changes, fever, chills, weakness. Yes fatigue.  HEENT:  - Eyes: No visual changes.  - Ears: No auditory changes. No pain.  - Nose: No sneezing, congestion, runny nose. - Throat: No sore throat. No changes in swallowing. SKIN: No rash or itching. Hair loss. CARDIOVASCULAR: No chest pain, chest pressure or chest discomfort. No palpitations or edema.  RESPIRATORY: No shortness of breath, cough or sputum.  GASTROINTESTINAL: No anorexia, nausea, vomiting. No changes in bowel habits. No abdominal pain or blood.  GENITOURINARY: No dysuria. No frequency. No discharge.  NEUROLOGICAL: No headache, dizziness, syncope, paralysis, ataxia, numbness or tingling in the extremities. No memory changes. No change in bowel or bladder control.  MUSCULOSKELETAL: No joint pain. No muscle pain. HEMATOLOGIC: No anemia, bleeding or bruising.  LYMPHATICS: No enlarged lymph nodes.  PSYCHIATRIC: No change in mood. No change in sleep pattern.  ENDOCRINOLOGIC: No reports of sweating, cold or heat intolerance.  No polyuria or polydipsia.     Objective  BP 122/64 mmHg  Pulse 74  Temp(Src) 97.7 F (36.5 C) (Oral)  Resp 14  Ht 5' (1.524 m)  Wt 137 lb (62.143 kg)  BMI 26.76 kg/m2  SpO2 97% Body mass index is 26.76 kg/(m^2).  Physical Exam  Constitutional: Patient appears well-developed and well-nourished. In no distress.  HEENT:  - Head: Normocephalic and atraumatic.  - Ears: Bilateral TMs gray, no erythema or effusion - Nose: Nasal mucosa moist - Mouth/Throat: Oropharynx is clear and moist. No tonsillar hypertrophy or erythema. No post nasal drainage.  - Eyes: Conjunctivae clear, EOM movements normal. PERRLA. No scleral icterus.  Neck: Normal range of motion. Neck supple. No JVD present. No thyromegaly present.  Cardiovascular: Normal rate, regular rhythm and normal heart sounds.  No murmur heard.  Pulmonary/Chest: Effort normal and breath sounds normal. No respiratory distress. Musculoskeletal: Normal range of motion bilateral UE and LE, no joint effusions. Peripheral vascular: Bilateral LE no edema.  Neurological: CN II-XII grossly intact with no focal deficits. Alert and oriented to person, place, and time. Coordination, balance, strength, speech and gait are normal.  Skin: Skin is warm and dry. No rash noted. No erythema.  Psychiatric: Patient has a normal mood and affect. Behavior is normal in office today. Judgment and thought content normal in office today.  Assessment & Plan  1. Hypothyroidism due to acquired atrophy of thyroid Recheck thyroid panel and adjust med dose as needed. Hair loss may be due to smoking cessation changes in follicles. Reassured patient.  - CBC with Differential/Platelet - Comprehensive metabolic panel - TSH - T3, free - T4, free  2. Encounter for smoking cessation counseling Doing very well with cessation effort s  3. COPD, mild ([place]) Doing well since smoking cessation.  4. Hyperlipidemia LDL goal <100 Recheck FLP  - CBC with  Differential/Platelet - Comprehensive metabolic panel - Lipid panel  5. Osteoporosis Intolerant to bisphosphonate.   6. Other fatigue Multifactorial likely.   - CBC with Differential/Platelet - Comprehensive metabolic panel

## 2015-07-03 LAB — CBC WITH DIFFERENTIAL/PLATELET
Basophils Absolute: 0.1 10*3/uL (ref 0.0–0.2)
Basos: 1 %
EOS (ABSOLUTE): 0.8 10*3/uL — ABNORMAL HIGH (ref 0.0–0.4)
EOS: 13 %
HEMATOCRIT: 43 % (ref 34.0–46.6)
HEMOGLOBIN: 14.6 g/dL (ref 11.1–15.9)
IMMATURE GRANS (ABS): 0 10*3/uL (ref 0.0–0.1)
IMMATURE GRANULOCYTES: 0 %
Lymphocytes Absolute: 2.1 10*3/uL (ref 0.7–3.1)
Lymphs: 31 %
MCH: 29.2 pg (ref 26.6–33.0)
MCHC: 34 g/dL (ref 31.5–35.7)
MCV: 86 fL (ref 79–97)
MONOCYTES: 5 %
Monocytes Absolute: 0.4 10*3/uL (ref 0.1–0.9)
NEUTROS PCT: 50 %
Neutrophils Absolute: 3.3 10*3/uL (ref 1.4–7.0)
Platelets: 337 10*3/uL (ref 150–379)
RBC: 5 x10E6/uL (ref 3.77–5.28)
RDW: 13.4 % (ref 12.3–15.4)
WBC: 6.6 10*3/uL (ref 3.4–10.8)

## 2015-07-03 LAB — COMPREHENSIVE METABOLIC PANEL
ALBUMIN: 4.3 g/dL (ref 3.6–4.8)
ALT: 28 IU/L (ref 0–32)
AST: 24 IU/L (ref 0–40)
Albumin/Globulin Ratio: 1.9 (ref 1.1–2.5)
Alkaline Phosphatase: 46 IU/L (ref 39–117)
BUN/Creatinine Ratio: 15 (ref 11–26)
BUN: 12 mg/dL (ref 8–27)
Bilirubin Total: 0.6 mg/dL (ref 0.0–1.2)
CALCIUM: 9.8 mg/dL (ref 8.7–10.3)
CO2: 24 mmol/L (ref 18–29)
CREATININE: 0.8 mg/dL (ref 0.57–1.00)
Chloride: 100 mmol/L (ref 96–106)
GFR calc Af Amer: 88 mL/min/{1.73_m2} (ref >59)
GFR, EST NON AFRICAN AMERICAN: 76 mL/min/{1.73_m2} (ref >59)
GLOBULIN, TOTAL: 2.3 g/dL (ref 1.5–4.5)
Glucose: 91 mg/dL (ref 65–99)
Potassium: 5.4 mmol/L — ABNORMAL HIGH (ref 3.5–5.2)
SODIUM: 142 mmol/L (ref 134–144)
Total Protein: 6.6 g/dL (ref 6.0–8.5)

## 2015-07-03 LAB — LIPID PANEL
CHOL/HDL RATIO: 3.1 ratio (ref 0.0–4.4)
Cholesterol, Total: 174 mg/dL (ref 100–199)
HDL: 57 mg/dL (ref >39)
LDL CALC: 90 mg/dL (ref 0–99)
TRIGLYCERIDES: 137 mg/dL (ref 0–149)
VLDL Cholesterol Cal: 27 mg/dL (ref 5–40)

## 2015-07-03 LAB — T4, FREE: FREE T4: 1.56 ng/dL (ref 0.82–1.77)

## 2015-07-03 LAB — TSH: TSH: 0.067 u[IU]/mL — ABNORMAL LOW (ref 0.450–4.500)

## 2015-07-03 LAB — T3, FREE: T3, Free: 3.5 pg/mL (ref 2.0–4.4)

## 2015-07-04 ENCOUNTER — Other Ambulatory Visit: Payer: Self-pay | Admitting: Family Medicine

## 2015-07-04 DIAGNOSIS — J309 Allergic rhinitis, unspecified: Secondary | ICD-10-CM | POA: Insufficient documentation

## 2015-07-04 DIAGNOSIS — E034 Atrophy of thyroid (acquired): Secondary | ICD-10-CM

## 2015-07-04 DIAGNOSIS — R0982 Postnasal drip: Secondary | ICD-10-CM

## 2015-07-04 MED ORDER — LEVOTHYROXINE SODIUM 50 MCG PO TABS: ORAL_TABLET | ORAL | Status: AC

## 2015-07-04 MED ORDER — FLUTICASONE PROPIONATE 50 MCG/ACT NA SUSP: 2.0000 | Freq: Every day | NASAL | Status: AC

## 2015-09-01 ENCOUNTER — Other Ambulatory Visit: Payer: Self-pay | Admitting: Internal Medicine

## 2015-09-01 DIAGNOSIS — R109 Unspecified abdominal pain: Secondary | ICD-10-CM

## 2015-09-07 ENCOUNTER — Ambulatory Visit
Admission: RE | Admit: 2015-09-07 | Discharge: 2015-09-07 | Disposition: A | Discharge status: Home / Self Care | Payer: Medicare Other | Source: Ambulatory Visit | Attending: Internal Medicine | Admitting: Internal Medicine

## 2015-09-07 DIAGNOSIS — R109 Unspecified abdominal pain: Secondary | ICD-10-CM

## 2015-09-21 ENCOUNTER — Other Ambulatory Visit: Payer: Self-pay

## 2015-09-21 NOTE — Telephone Encounter (Signed)
Please verify with patient if she has transferred care; PCP is now listed as [facility] Re[facility] If so, please decline refill and have her get that from her new provider I would encourage her to have her thyroid rechecked with her new doctor; previous lab was out of range If she is a patient here still, please schedule her an appt to see me soon and we'll get labs then, then back to me for refill Thank you

## 2015-09-24 NOTE — Telephone Encounter (Signed)
I have called and left several voicemails with no response

## 2015-12-29 ENCOUNTER — Other Ambulatory Visit: Payer: Self-pay | Admitting: Internal Medicine

## 2015-12-29 DIAGNOSIS — Z1231 Encounter for screening mammogram for malignant neoplasm of breast: Secondary | ICD-10-CM

## 2016-01-19 ENCOUNTER — Ambulatory Visit
Admission: RE | Admit: 2016-01-19 | Discharge: 2016-01-19 | Disposition: A | Discharge status: Home / Self Care | Payer: Medicare Other | Source: Ambulatory Visit | Attending: Internal Medicine | Admitting: Internal Medicine

## 2016-01-19 ENCOUNTER — Other Ambulatory Visit: Payer: Self-pay | Admitting: Internal Medicine

## 2016-01-19 DIAGNOSIS — Z1231 Encounter for screening mammogram for malignant neoplasm of breast: Secondary | ICD-10-CM | POA: Insufficient documentation

## 2016-11-30 ENCOUNTER — Other Ambulatory Visit: Payer: Self-pay | Admitting: Internal Medicine

## 2016-11-30 DIAGNOSIS — R928 Other abnormal and inconclusive findings on diagnostic imaging of breast: Secondary | ICD-10-CM

## 2017-02-05 ENCOUNTER — Ambulatory Visit: Payer: Medicare Other

## 2017-02-12 ENCOUNTER — Ambulatory Visit
Admission: RE | Admit: 2017-02-12 | Discharge: 2017-02-12 | Disposition: A | Discharge status: Home / Self Care | Payer: Medicare Other | Source: Ambulatory Visit | Attending: Internal Medicine | Admitting: Internal Medicine

## 2017-02-12 DIAGNOSIS — R928 Other abnormal and inconclusive findings on diagnostic imaging of breast: Secondary | ICD-10-CM

## 2017-02-14 ENCOUNTER — Other Ambulatory Visit: Payer: Self-pay | Admitting: Internal Medicine

## 2017-02-14 DIAGNOSIS — N6310 Unspecified lump in the right breast, unspecified quadrant: Secondary | ICD-10-CM

## 2017-02-21 ENCOUNTER — Ambulatory Visit
Admission: RE | Admit: 2017-02-21 | Discharge: 2017-02-21 | Disposition: A | Discharge status: Home / Self Care | Payer: Medicare Other | Source: Ambulatory Visit | Attending: Internal Medicine | Admitting: Internal Medicine

## 2017-02-21 DIAGNOSIS — N6310 Unspecified lump in the right breast, unspecified quadrant: Secondary | ICD-10-CM

## 2017-02-21 DIAGNOSIS — R928 Other abnormal and inconclusive findings on diagnostic imaging of breast: Secondary | ICD-10-CM | POA: Insufficient documentation

## 2017-07-27 DIAGNOSIS — H26493 Other secondary cataract, bilateral: Secondary | ICD-10-CM | POA: Diagnosis not present

## 2017-08-06 DIAGNOSIS — H26493 Other secondary cataract, bilateral: Secondary | ICD-10-CM | POA: Diagnosis not present

## 2017-09-06 DIAGNOSIS — S40862A Insect bite (nonvenomous) of left upper arm, initial encounter: Secondary | ICD-10-CM | POA: Diagnosis not present

## 2017-09-06 DIAGNOSIS — Z79899 Other long term (current) drug therapy: Secondary | ICD-10-CM | POA: Diagnosis not present

## 2017-09-06 DIAGNOSIS — W57XXXA Bitten or stung by nonvenomous insect and other nonvenomous arthropods, initial encounter: Secondary | ICD-10-CM | POA: Diagnosis not present

## 2017-09-06 DIAGNOSIS — E7800 Pure hypercholesterolemia, unspecified: Secondary | ICD-10-CM | POA: Diagnosis not present

## 2017-09-13 DIAGNOSIS — Z0000 Encounter for general adult medical examination without abnormal findings: Secondary | ICD-10-CM | POA: Diagnosis not present

## 2017-09-13 DIAGNOSIS — Z79899 Other long term (current) drug therapy: Secondary | ICD-10-CM | POA: Diagnosis not present

## 2017-09-13 DIAGNOSIS — J42 Unspecified chronic bronchitis: Secondary | ICD-10-CM | POA: Diagnosis not present

## 2017-09-13 DIAGNOSIS — E039 Hypothyroidism, unspecified: Secondary | ICD-10-CM | POA: Diagnosis not present

## 2017-09-13 DIAGNOSIS — J449 Chronic obstructive pulmonary disease, unspecified: Secondary | ICD-10-CM | POA: Diagnosis not present

## 2017-09-13 DIAGNOSIS — F3341 Major depressive disorder, recurrent, in partial remission: Secondary | ICD-10-CM | POA: Diagnosis not present

## 2017-09-13 DIAGNOSIS — Z1231 Encounter for screening mammogram for malignant neoplasm of breast: Secondary | ICD-10-CM | POA: Diagnosis not present

## 2017-09-13 DIAGNOSIS — M25561 Pain in right knee: Secondary | ICD-10-CM | POA: Diagnosis not present

## 2017-09-13 DIAGNOSIS — E7800 Pure hypercholesterolemia, unspecified: Secondary | ICD-10-CM | POA: Diagnosis not present

## 2017-09-21 DIAGNOSIS — M170 Bilateral primary osteoarthritis of knee: Secondary | ICD-10-CM | POA: Diagnosis not present

## 2017-10-03 DIAGNOSIS — M79604 Pain in right leg: Secondary | ICD-10-CM | POA: Diagnosis not present

## 2017-10-03 DIAGNOSIS — M79605 Pain in left leg: Secondary | ICD-10-CM | POA: Diagnosis not present

## 2017-10-11 DIAGNOSIS — H02834 Dermatochalasis of left upper eyelid: Secondary | ICD-10-CM | POA: Diagnosis not present

## 2017-10-11 DIAGNOSIS — H02831 Dermatochalasis of right upper eyelid: Secondary | ICD-10-CM | POA: Diagnosis not present

## 2017-12-06 ENCOUNTER — Other Ambulatory Visit: Payer: Self-pay | Admitting: Internal Medicine

## 2017-12-06 DIAGNOSIS — Z1231 Encounter for screening mammogram for malignant neoplasm of breast: Secondary | ICD-10-CM

## 2018-01-04 DIAGNOSIS — Z86010 Personal history of colonic polyps: Secondary | ICD-10-CM | POA: Diagnosis not present

## 2018-01-04 DIAGNOSIS — K219 Gastro-esophageal reflux disease without esophagitis: Secondary | ICD-10-CM | POA: Diagnosis not present

## 2018-01-17 DIAGNOSIS — E039 Hypothyroidism, unspecified: Secondary | ICD-10-CM | POA: Diagnosis not present

## 2018-01-17 DIAGNOSIS — E785 Hyperlipidemia, unspecified: Secondary | ICD-10-CM | POA: Diagnosis not present

## 2018-01-17 DIAGNOSIS — J449 Chronic obstructive pulmonary disease, unspecified: Secondary | ICD-10-CM | POA: Diagnosis not present

## 2018-01-17 DIAGNOSIS — F419 Anxiety disorder, unspecified: Secondary | ICD-10-CM | POA: Diagnosis not present

## 2018-01-17 DIAGNOSIS — F329 Major depressive disorder, single episode, unspecified: Secondary | ICD-10-CM | POA: Diagnosis not present

## 2018-01-17 DIAGNOSIS — K219 Gastro-esophageal reflux disease without esophagitis: Secondary | ICD-10-CM | POA: Diagnosis not present

## 2018-01-18 DIAGNOSIS — Z7982 Long term (current) use of aspirin: Secondary | ICD-10-CM | POA: Diagnosis not present

## 2018-01-18 DIAGNOSIS — K219 Gastro-esophageal reflux disease without esophagitis: Secondary | ICD-10-CM | POA: Diagnosis not present

## 2018-01-18 DIAGNOSIS — E785 Hyperlipidemia, unspecified: Secondary | ICD-10-CM | POA: Diagnosis not present

## 2018-01-18 DIAGNOSIS — J449 Chronic obstructive pulmonary disease, unspecified: Secondary | ICD-10-CM | POA: Diagnosis not present

## 2018-01-18 DIAGNOSIS — H02831 Dermatochalasis of right upper eyelid: Secondary | ICD-10-CM | POA: Diagnosis not present

## 2018-01-18 DIAGNOSIS — F17210 Nicotine dependence, cigarettes, uncomplicated: Secondary | ICD-10-CM | POA: Diagnosis not present

## 2018-01-18 DIAGNOSIS — E039 Hypothyroidism, unspecified: Secondary | ICD-10-CM | POA: Diagnosis not present

## 2018-01-18 DIAGNOSIS — F419 Anxiety disorder, unspecified: Secondary | ICD-10-CM | POA: Diagnosis not present

## 2018-01-18 DIAGNOSIS — H02834 Dermatochalasis of left upper eyelid: Secondary | ICD-10-CM | POA: Diagnosis not present

## 2018-02-06 DIAGNOSIS — M170 Bilateral primary osteoarthritis of knee: Secondary | ICD-10-CM | POA: Diagnosis not present

## 2018-02-22 ENCOUNTER — Ambulatory Visit
Admission: RE | Admit: 2018-02-22 | Discharge: 2018-02-22 | Disposition: A | Discharge status: Home / Self Care | Payer: Medicare HMO | Source: Ambulatory Visit | Attending: Internal Medicine | Admitting: Internal Medicine

## 2018-02-22 DIAGNOSIS — Z1231 Encounter for screening mammogram for malignant neoplasm of breast: Secondary | ICD-10-CM | POA: Diagnosis not present

## 2018-03-12 ENCOUNTER — Encounter: Payer: Self-pay | Admitting: *Deleted

## 2018-03-13 ENCOUNTER — Encounter: Payer: Self-pay | Admitting: *Deleted

## 2018-03-13 ENCOUNTER — Ambulatory Visit: Payer: Medicare HMO | Admitting: Anesthesiology

## 2018-03-13 ENCOUNTER — Encounter
Admission: RE | Disposition: A | Discharge status: Home / Self Care | Payer: Self-pay | Source: Ambulatory Visit | Attending: Unknown Physician Specialty

## 2018-03-13 ENCOUNTER — Ambulatory Visit
Admission: RE | Admit: 2018-03-13 | Discharge: 2018-03-13 | Disposition: A | Discharge status: Home / Self Care | Payer: Medicare HMO | Source: Ambulatory Visit | Attending: Unknown Physician Specialty | Admitting: Unknown Physician Specialty

## 2018-03-13 DIAGNOSIS — Z7982 Long term (current) use of aspirin: Secondary | ICD-10-CM | POA: Diagnosis not present

## 2018-03-13 DIAGNOSIS — E785 Hyperlipidemia, unspecified: Secondary | ICD-10-CM | POA: Diagnosis not present

## 2018-03-13 DIAGNOSIS — K2270 Barrett's esophagus without dysplasia: Secondary | ICD-10-CM | POA: Diagnosis not present

## 2018-03-13 DIAGNOSIS — K209 Esophagitis, unspecified: Secondary | ICD-10-CM | POA: Diagnosis not present

## 2018-03-13 DIAGNOSIS — Z87891 Personal history of nicotine dependence: Secondary | ICD-10-CM | POA: Insufficient documentation

## 2018-03-13 DIAGNOSIS — D123 Benign neoplasm of transverse colon: Secondary | ICD-10-CM | POA: Insufficient documentation

## 2018-03-13 DIAGNOSIS — F419 Anxiety disorder, unspecified: Secondary | ICD-10-CM | POA: Diagnosis not present

## 2018-03-13 DIAGNOSIS — K3189 Other diseases of stomach and duodenum: Secondary | ICD-10-CM | POA: Insufficient documentation

## 2018-03-13 DIAGNOSIS — Z86010 Personal history of colonic polyps: Secondary | ICD-10-CM | POA: Insufficient documentation

## 2018-03-13 DIAGNOSIS — F329 Major depressive disorder, single episode, unspecified: Secondary | ICD-10-CM | POA: Insufficient documentation

## 2018-03-13 DIAGNOSIS — M810 Age-related osteoporosis without current pathological fracture: Secondary | ICD-10-CM | POA: Diagnosis not present

## 2018-03-13 DIAGNOSIS — K259 Gastric ulcer, unspecified as acute or chronic, without hemorrhage or perforation: Secondary | ICD-10-CM | POA: Insufficient documentation

## 2018-03-13 DIAGNOSIS — K319 Disease of stomach and duodenum, unspecified: Secondary | ICD-10-CM | POA: Insufficient documentation

## 2018-03-13 DIAGNOSIS — J449 Chronic obstructive pulmonary disease, unspecified: Secondary | ICD-10-CM | POA: Insufficient documentation

## 2018-03-13 DIAGNOSIS — K635 Polyp of colon: Secondary | ICD-10-CM | POA: Diagnosis not present

## 2018-03-13 DIAGNOSIS — K640 First degree hemorrhoids: Secondary | ICD-10-CM | POA: Insufficient documentation

## 2018-03-13 DIAGNOSIS — D126 Benign neoplasm of colon, unspecified: Secondary | ICD-10-CM | POA: Diagnosis not present

## 2018-03-13 DIAGNOSIS — Z1211 Encounter for screening for malignant neoplasm of colon: Secondary | ICD-10-CM | POA: Diagnosis not present

## 2018-03-13 DIAGNOSIS — K2980 Duodenitis without bleeding: Secondary | ICD-10-CM | POA: Diagnosis not present

## 2018-03-13 DIAGNOSIS — K2970 Gastritis, unspecified, without bleeding: Secondary | ICD-10-CM | POA: Diagnosis not present

## 2018-03-13 DIAGNOSIS — Z79899 Other long term (current) drug therapy: Secondary | ICD-10-CM | POA: Diagnosis not present

## 2018-03-13 DIAGNOSIS — K208 Other esophagitis: Secondary | ICD-10-CM | POA: Diagnosis not present

## 2018-03-13 DIAGNOSIS — E039 Hypothyroidism, unspecified: Secondary | ICD-10-CM | POA: Diagnosis not present

## 2018-03-13 DIAGNOSIS — K648 Other hemorrhoids: Secondary | ICD-10-CM | POA: Diagnosis not present

## 2018-03-13 DIAGNOSIS — Z79890 Hormone replacement therapy (postmenopausal): Secondary | ICD-10-CM | POA: Insufficient documentation

## 2018-03-13 DIAGNOSIS — R1013 Epigastric pain: Secondary | ICD-10-CM | POA: Insufficient documentation

## 2018-03-13 DIAGNOSIS — Z7951 Long term (current) use of inhaled steroids: Secondary | ICD-10-CM | POA: Diagnosis not present

## 2018-03-13 DIAGNOSIS — K210 Gastro-esophageal reflux disease with esophagitis: Secondary | ICD-10-CM | POA: Diagnosis not present

## 2018-03-13 HISTORY — DX: Gastro-esophageal reflux disease without esophagitis: K21.9

## 2018-03-13 HISTORY — DX: Headache, unspecified: R51.9

## 2018-03-13 HISTORY — PX: ESOPHAGOGASTRODUODENOSCOPY (EGD) WITH PROPOFOL: SHX5813

## 2018-03-13 HISTORY — DX: Major depressive disorder, single episode, unspecified: F32.9

## 2018-03-13 HISTORY — DX: Headache: R51

## 2018-03-13 HISTORY — PX: COLONOSCOPY WITH PROPOFOL: SHX5780

## 2018-03-13 HISTORY — DX: Unspecified convulsions: R56.9

## 2018-03-13 HISTORY — DX: Depression, unspecified: F32.A

## 2018-03-13 SURGERY — COLONOSCOPY WITH PROPOFOL
Anesthesia: General

## 2018-03-13 MED ORDER — PROPOFOL 10 MG/ML IV BOLUS
INTRAVENOUS | Status: DC | PRN
  Administered 2018-03-13: 60 mg via INTRAVENOUS
  Administered 2018-03-13: 40 mg via INTRAVENOUS

## 2018-03-13 MED ORDER — LIDOCAINE HCL (CARDIAC) PF 100 MG/5ML IV SOSY
PREFILLED_SYRINGE | INTRAVENOUS | Status: DC | PRN
  Administered 2018-03-13: 80 mg via INTRAVENOUS

## 2018-03-13 MED ORDER — FENTANYL CITRATE (PF) 100 MCG/2ML IJ SOLN
INTRAMUSCULAR | Status: AC
  Filled 2018-03-13: qty 2

## 2018-03-13 MED ORDER — FENTANYL CITRATE (PF) 100 MCG/2ML IJ SOLN
INTRAMUSCULAR | Status: DC | PRN
  Administered 2018-03-13 (×2): 25 ug via INTRAVENOUS
  Administered 2018-03-13: 50 ug via INTRAVENOUS

## 2018-03-13 MED ORDER — PROPOFOL 10 MG/ML IV BOLUS
INTRAVENOUS | Status: AC
  Filled 2018-03-13: qty 40

## 2018-03-13 MED ORDER — SODIUM CHLORIDE 0.9 % IV SOLN
INTRAVENOUS | Status: DC
  Administered 2018-03-13: 11:00:00 via INTRAVENOUS

## 2018-03-13 MED ORDER — PROPOFOL 500 MG/50ML IV EMUL
INTRAVENOUS | Status: DC | PRN
  Administered 2018-03-13: 100 ug/kg/min via INTRAVENOUS

## 2018-03-13 MED ORDER — SODIUM CHLORIDE 0.9 % IV SOLN: INTRAVENOUS | Status: DC

## 2018-03-13 NOTE — Op Note (Signed)
[facility] Patient Name: Karen Moon Procedure Date: 03/13/2018 11:03 AM MRN: [id number] Account #: [id number] Date of Birth: 02-17-1946 Admit Type: Outpatient Age: 72 Room: [facility] ENDO ROOM 2 Gender: Female Note Status: Finalized Procedure:            Upper GI endoscopy Indications:          Epigastric abdominal pain, Heartburn Providers:            Manya Silvas, MD Referring MD:         Leonie Douglas. Doy Hutching, MD (Referring MD) Medicines:            Propofol per Anesthesia Complications:        No immediate complications. Procedure:            Pre-Anesthesia Assessment:                       - After reviewing the risks and benefits, the patient                        was deemed in satisfactory condition to undergo the                        procedure.                       After obtaining informed consent, the endoscope was                        passed under direct vision. Throughout the procedure,                        the patient's blood pressure, pulse, and oxygen                        saturations were monitored continuously. The Endoscope                        was introduced through the mouth, and advanced to the                        second part of duodenum. The upper GI endoscopy was                        accomplished without difficulty. The patient tolerated                        the procedure well. Findings:      LA Grade B (one or more mucosal breaks greater than 5 mm, not extending       between the tops of two mucosal folds) esophagitis with no bleeding was       found 38 cm from the incisors. Biopsies were taken with a cold forceps       for histology.      Localized moderate inflammation characterized by erythema and       granularity was found in the gastric antrum. Biopsies were taken with a       cold forceps for histology. One small linear ulcer in antrum, other       areas of injflammation jnoted.      Localized  mildly erythematous mucosa without active bleeding and with no  stigmata of bleeding was found in the duodenal bulb. Impression:           - LA Grade B reflux esophagitis. Rule out Barrett's                        esophagus. Biopsied.                       - Gastritis. Biopsied.                       - Erythematous duodenopathy. Recommendation:       - Await pathology results. Manya Silvas, MD 03/13/2018 11:22:01 AM This report has been signed electronically. Number of Addenda: 0 Note Initiated On: 03/13/2018 11:03 AM      [facility]

## 2018-03-13 NOTE — Anesthesia Postprocedure Evaluation (Signed)
Anesthesia Post Note  Patient: Karen Moon  Procedure(s) Performed: COLONOSCOPY WITH PROPOFOL (N/A ) ESOPHAGOGASTRODUODENOSCOPY (EGD) WITH PROPOFOL (N/A )  Patient location during evaluation: PACU Anesthesia Type: General Level of consciousness: awake and alert Pain management: pain level controlled Vital Signs Assessment: post-procedure vital signs reviewed and stable Respiratory status: spontaneous breathing, nonlabored ventilation, respiratory function stable and patient connected to nasal cannula oxygen Cardiovascular status: blood pressure returned to baseline and stable Postop Assessment: no apparent nausea or vomiting Anesthetic complications: no     Last Vitals:  Vitals:   03/13/18 1206 03/13/18 1216  BP: (!) 148/68 139/74  Pulse: (!) 57 (!) 51  Resp: 12 13  Temp:    SpO2: 99% 99%    Last Pain:  Vitals:   03/13/18 1216  TempSrc:   PainSc: 0-No pain                 Molli Barrows

## 2018-03-13 NOTE — Anesthesia Post-op Follow-up Note (Signed)
Anesthesia QCDR form completed.        

## 2018-03-13 NOTE — Transfer of Care (Signed)
Immediate Anesthesia Transfer of Care Note  Patient: TZIVIA ONEIL  Procedure(s) Performed: COLONOSCOPY WITH PROPOFOL (N/A ) ESOPHAGOGASTRODUODENOSCOPY (EGD) WITH PROPOFOL (N/A )  Patient Location: PACU  Anesthesia Type:General  Level of Consciousness: awake, alert  and oriented  Airway & Oxygen Therapy: Patient Spontanous Breathing and Patient connected to nasal cannula oxygen  Post-op Assessment: Report given to RN and Post -op Vital signs reviewed and stable  Post vital signs: Reviewed and stable  Last Vitals:  Vitals Value Taken Time  BP 119/64 03/13/2018 11:46 AM  Temp 36.1 C 03/13/2018 11:46 AM  Pulse 68 03/13/2018 11:47 AM  Resp 12 03/13/2018 11:47 AM  SpO2 100 % 03/13/2018 11:47 AM  Vitals shown include unvalidated device data.  Last Pain:  Vitals:   03/13/18 1146  TempSrc: Tympanic         Complications: No apparent anesthesia complications

## 2018-03-13 NOTE — Anesthesia Preprocedure Evaluation (Signed)
Anesthesia Evaluation  Patient identified by MRN, date of birth, ID band Patient awake    Reviewed: Allergy & Precautions, H&P , NPO status , Patient's Chart, lab work & pertinent test results, reviewed documented beta blocker date and time   Airway Mallampati: II   Neck ROM: full    Dental  (+) Poor Dentition   Pulmonary neg pulmonary ROS, COPD, Current Smoker,    Pulmonary exam normal        Cardiovascular Exercise Tolerance: Good negative cardio ROS Normal cardiovascular exam Rhythm:regular Rate:Normal     Neuro/Psych  Headaches, Seizures -,  PSYCHIATRIC DISORDERS Anxiety Depression negative neurological ROS  negative psych ROS   GI/Hepatic negative GI ROS, Neg liver ROS, GERD  ,  Endo/Other  negative endocrine ROSHypothyroidism   Renal/GU negative Renal ROS  negative genitourinary   Musculoskeletal   Abdominal   Peds  Hematology negative hematology ROS (+)   Anesthesia Other Findings Past Medical History: No date: Common cold No date: Depression No date: GERD (gastroesophageal reflux disease) No date: Headache No date: Hyperlipemia No date: Hypothyroidism No date: Insomnia No date: Osteoporosis No date: Seizure ([place]) No date: Vaginal vault prolapse     Comment:  ANTERIOR Past Surgical History: 2013: CATARACT EXTRACTION W/ INTRAOCULAR LENS IMPLANT; Left 01/26/2015: COLONOSCOPY WITH PROPOFOL; N/A     Comment:  Procedure: COLONOSCOPY WITH PROPOFOL;  Surgeon: Lucilla Lame, MD;  Location: [facility];  Service: Endoscopy;              Laterality: N/A; 07/15/2012: CYSTOSCOPY; N/A     Comment:  Procedure: CYSTOSCOPY;  Surgeon: Ailene Rud,               MD;  Location: [facility];  Service:               Urology;  Laterality: N/A; 2004: MULTIPLE TOOTH EXTRACTIONS     Comment:  ORAL MD OFFICE 07/15/2012: PUBOVAGINAL SLING; N/A     Comment:  Procedure: [name];  Surgeon: Ailene Rud, MD;  Location: [place];                Service: Urology;  Laterality: N/A;  UPHOLD LITE ANTERIOR              VAULT REPAIR SOLYX SLING   Reproductive/Obstetrics negative OB ROS                             Anesthesia Physical Anesthesia Plan  ASA: III  Anesthesia Plan: General   Post-op Pain Management:    Induction:   PONV Risk Score and Plan:   Airway Management Planned:   Additional Equipment:   Intra-op Plan:   Post-operative Plan:   Informed Consent: I have reviewed the patients History and Physical, chart, labs and discussed the procedure including the risks, benefits and alternatives for the proposed anesthesia with the patient or authorized representative who has indicated his/her understanding and acceptance.   Dental Advisory Given  Plan Discussed with: CRNA  Anesthesia Plan Comments:         Anesthesia Quick Evaluation

## 2018-03-13 NOTE — H&P (Signed)
Primary Care Physician:  Idelle Crouch, MD Primary Gastroenterologist:  Dr. Vira Agar  Pre-Procedure History & Physical: HPI:  Karen Moon is a 72 y.o. female is here for an endoscopy and colonoscopy.   Past Medical History:  Diagnosis Date  . Common cold   . Depression   . GERD (gastroesophageal reflux disease)   . Headache   . Hyperlipemia   . Hypothyroidism   . Insomnia   . Osteoporosis   . Seizure ([place])   . Vaginal vault prolapse    ANTERIOR    Past Surgical History:  Procedure Laterality Date  . CATARACT EXTRACTION W/ INTRAOCULAR LENS IMPLANT Left 2013  . COLONOSCOPY WITH PROPOFOL N/A 01/26/2015   Procedure: COLONOSCOPY WITH PROPOFOL;  Surgeon: Lucilla Lame, MD;  Location: [facility] END[facility]COPY;  Service: Endoscopy;  Laterality: N/A;  . CYSTOSCOPY N/A 07/15/2012   Procedure: CYSTOSCOPY;  Surgeon: Ailene Rud, MD;  Location: [facility];  Service: Urology;  Laterality: N/A;  . MULTIPLE TOOTH EXTRACTIONS  2004   ORAL MD OFFICE  . PUBOVAGINAL SLING N/A 07/15/2012   Procedure: [name];  Surgeon: Ailene Rud, MD;  Location: [facility];  Service: Urology;  Laterality: N/A;  UPHOLD LITE ANTERIOR VAULT REPAIR SOLYX SLING    Prior to Admission medications   Medication Sig Start Date End Date Taking? Authorizing Provider  levothyroxine (SYNTHROID, LEVOTHROID) 50 MCG tablet Take 50 mcg PO empty stomach in the am daily. 07/04/15  Yes Bobetta Lime, MD  Omega 3-6-9 CAPS Take 1 capsule by mouth daily.   Yes [provider]  omeprazole-sodium bicarbonate (ZEGERID) 40-1100 MG capsule Take 1 capsule by mouth daily before breakfast.   Yes [provider]  pravastatin (PRAVACHOL) 40 MG tablet Take 1 tablet by mouth at  bedtime 03/22/15  Yes Bobetta Lime, MD  aspirin EC 81 MG tablet Take 1 tablet by mouth daily.    [provider]  Biotin 1000 MCG tablet Take 1 tablet by mouth daily.    [provider]  buPROPion (WELLBUTRIN SR) 150 MG 12 hr tablet Take 1 tablet by mouth two  times daily 03/30/15   Bobetta Lime, MD  calcium carbonate 1250 MG capsule Take 1,250 mg by mouth 2 (two) times daily with a meal.    [provider]  Cholecalciferol (VITAMIN D3) 2000 UNITS capsule Take 1 capsule by mouth daily.    [provider]  Clobetasol Prop Emollient Base 0.05 % emollient cream Apply 1 application topically 2 (two) times daily. 12/29/14   Bobetta Lime, MD  fluticasone (FLONASE) 50 MCG/ACT nasal spray Place 2 sprays into both nostrils daily. 07/04/15   Bobetta Lime, MD  Multiple Vitamins-Minerals (MULTIVITAL PLATINUM SILVER PO) Take 1 tablet by mouth daily.    [provider]    Allergies as of 02/01/2018  . (No Known Allergies)    Family History  Problem Relation Age of Onset  . Breast cancer Neg Hx     Social History   Socioeconomic History  . Marital status: Widowed    Spouse name: Not on file  . Number of children: Not on file  . Years of education: Not on file  . Highest education level: Not on file  Occupational History  . Not on file  Social Needs  . Financial resource strain: Not on file  . Food insecurity:    Worry: Not on file    Inability: Not on file  . Transportation needs:  Medical: Not on file    Non-medical: Not on file  Tobacco Use  . Smoking status: Former Smoker    Packs/day: 1.00    Years: 40.00    Pack years: 40.00    Types: Cigarettes    Last attempt to quit: 02/07/2017    Years since quitting: 1.0  . Smokeless tobacco: Never Used  Substance and Sexual Activity  . Alcohol use: No    Alcohol/week: 0.0 standard drinks  . Drug use: No  . Sexual activity: Never  Lifestyle  . Physical activity:    Days per week: Not on file    Minutes per session: Not on file  . Stress: Not on file  Relationships  . Social connections:    Talks on phone: Not on file    Gets together: Not on file    Attends  religious service: Not on file    Active member of club or organization: Not on file    Attends meetings of clubs or organizations: Not on file    Relationship status: Not on file  . Intimate partner violence:    Fear of current or ex partner: Not on file    Emotionally abused: Not on file    Physically abused: Not on file    Forced sexual activity: Not on file  Other Topics Concern  . Not on file  Social History Narrative  . Not on file    Review of Systems: See HPI, otherwise negative ROS  Physical Exam: BP 139/74   Pulse (!) 51   Temp (!) 97 F (36.1 C) (Tympanic)   Resp 13   Ht 5' (1.524 m)   Wt 63 kg   SpO2 99%   BMI 27.15 kg/m  General:   Alert,  pleasant and cooperative in NAD Head:  Normocephalic and atraumatic. Neck:  Supple; no masses or thyromegaly. Lungs:  Clear throughout to auscultation.    Heart:  Regular rate and rhythm. Abdomen:  Soft, nontender and nondistended. Normal bowel sounds, without guarding, and without rebound.   Neurologic:  Alert and  oriented x4;  grossly normal neurologically.  Impression/Plan: Karen Moon is here for an endoscopy and colonoscopy to be performed for [facility] colon polyp and heartburn. 05/05/04 was last colonoscopy done by me.  Risks, benefits, limitations, and alternatives regarding  endoscopy and colonoscopy have been reviewed with the patient.  Questions have been answered.  All parties agreeable.   Gaylyn Cheers, MD  03/13/2018, 2:13 PM

## 2018-03-13 NOTE — Op Note (Signed)
[facility] Patient Name: Karen Moon Procedure Date: 03/13/2018 11:00 AM MRN: [id number] Account #: [id number] Date of Birth: 1946/04/11 Admit Type: Outpatient Age: 72 Room: [facility] ENDO ROOM 2 Gender: Female Note Status: Finalized Procedure:            Colonoscopy Indications:          High risk colon cancer surveillance: Personal history                        of colonic polyps Providers:            Manya Silvas, MD Referring MD:         Leonie Douglas. Doy Hutching, MD (Referring MD) Medicines:            Propofol per Anesthesia Complications:        No immediate complications. Procedure:            Pre-Anesthesia Assessment:                       - After reviewing the risks and benefits, the patient                        was deemed in satisfactory condition to undergo the                        procedure.                       After obtaining informed consent, the colonoscope was                        passed under direct vision. Throughout the procedure,                        the patient's blood pressure, pulse, and oxygen                        saturations were monitored continuously. The                        Colonoscope was introduced through the anus and                        advanced to the the cecum, identified by appendiceal                        orifice and ileocecal valve. The colonoscopy was                        somewhat difficult due to significant looping. The                        patient tolerated the procedure well. The quality of                        the bowel preparation was good. Findings:      A small polyp was found in the hepatic flexure. The polyp was sessile.       The polyp was removed with a jumbo cold forceps. Resection and retrieval       were complete. To prevent bleeding after the polypectomy,  one hemostatic       clip was successfully placed. There was no bleeding at the end of the       procedure.  Internal hemorrhoids were found during endoscopy. The hemorrhoids were       small and Grade I (internal hemorrhoids that do not prolapse).      The exam was otherwise without abnormality. Impression:           - One small polyp at the hepatic flexure, removed with                        a jumbo cold forceps. Resected and retrieved. Clip was                        placed.                       - Internal hemorrhoids.                       - The examination was otherwise normal. Recommendation:       - Await pathology results. Manya Silvas, MD 03/13/2018 11:47:49 AM This report has been signed electronically. Number of Addenda: 0 Note Initiated On: 03/13/2018 11:00 AM Scope Withdrawal Time: 0 hours 13 minutes 56 seconds  Total Procedure Duration: 0 hours 20 minutes 19 seconds       [facility]

## 2018-03-14 ENCOUNTER — Encounter: Payer: Self-pay | Admitting: Unknown Physician Specialty

## 2018-03-15 DIAGNOSIS — E7800 Pure hypercholesterolemia, unspecified: Secondary | ICD-10-CM | POA: Diagnosis not present

## 2018-03-15 DIAGNOSIS — E039 Hypothyroidism, unspecified: Secondary | ICD-10-CM | POA: Diagnosis not present

## 2018-03-15 DIAGNOSIS — Z79899 Other long term (current) drug therapy: Secondary | ICD-10-CM | POA: Diagnosis not present

## 2018-03-15 LAB — SURGICAL PATHOLOGY

## 2018-03-22 DIAGNOSIS — E039 Hypothyroidism, unspecified: Secondary | ICD-10-CM | POA: Diagnosis not present

## 2018-03-22 DIAGNOSIS — E7800 Pure hypercholesterolemia, unspecified: Secondary | ICD-10-CM | POA: Diagnosis not present

## 2018-03-22 DIAGNOSIS — J449 Chronic obstructive pulmonary disease, unspecified: Secondary | ICD-10-CM | POA: Diagnosis not present

## 2018-03-22 DIAGNOSIS — Z23 Encounter for immunization: Secondary | ICD-10-CM | POA: Diagnosis not present

## 2018-04-15 DIAGNOSIS — Z961 Presence of intraocular lens: Secondary | ICD-10-CM | POA: Diagnosis not present

## 2018-05-09 DIAGNOSIS — D239 Other benign neoplasm of skin, unspecified: Secondary | ICD-10-CM

## 2018-05-09 HISTORY — DX: Other benign neoplasm of skin, unspecified: D23.9

## 2018-10-07 DIAGNOSIS — M79672 Pain in left foot: Secondary | ICD-10-CM | POA: Diagnosis not present

## 2018-10-07 DIAGNOSIS — M722 Plantar fascial fibromatosis: Secondary | ICD-10-CM | POA: Diagnosis not present

## 2018-10-07 DIAGNOSIS — M79671 Pain in right foot: Secondary | ICD-10-CM | POA: Diagnosis not present

## 2018-10-24 DIAGNOSIS — D485 Neoplasm of uncertain behavior of skin: Secondary | ICD-10-CM | POA: Diagnosis not present

## 2018-12-18 DIAGNOSIS — M722 Plantar fascial fibromatosis: Secondary | ICD-10-CM | POA: Diagnosis not present

## 2018-12-18 DIAGNOSIS — M79671 Pain in right foot: Secondary | ICD-10-CM | POA: Diagnosis not present

## 2018-12-18 DIAGNOSIS — M79672 Pain in left foot: Secondary | ICD-10-CM | POA: Diagnosis not present

## 2018-12-31 DIAGNOSIS — E039 Hypothyroidism, unspecified: Secondary | ICD-10-CM | POA: Diagnosis not present

## 2018-12-31 DIAGNOSIS — Z79899 Other long term (current) drug therapy: Secondary | ICD-10-CM | POA: Diagnosis not present

## 2018-12-31 DIAGNOSIS — E7800 Pure hypercholesterolemia, unspecified: Secondary | ICD-10-CM | POA: Diagnosis not present

## 2019-01-20 DIAGNOSIS — M79671 Pain in right foot: Secondary | ICD-10-CM | POA: Diagnosis not present

## 2019-01-20 DIAGNOSIS — M722 Plantar fascial fibromatosis: Secondary | ICD-10-CM | POA: Diagnosis not present

## 2019-01-20 DIAGNOSIS — M79672 Pain in left foot: Secondary | ICD-10-CM | POA: Diagnosis not present

## 2019-01-21 ENCOUNTER — Other Ambulatory Visit: Payer: Self-pay | Admitting: Internal Medicine

## 2019-01-21 DIAGNOSIS — Z1231 Encounter for screening mammogram for malignant neoplasm of breast: Secondary | ICD-10-CM

## 2019-02-26 ENCOUNTER — Ambulatory Visit
Admission: RE | Admit: 2019-02-26 | Discharge: 2019-02-26 | Disposition: A | Discharge status: Home / Self Care | Payer: Medicare HMO | Source: Ambulatory Visit | Attending: Internal Medicine | Admitting: Internal Medicine

## 2019-02-26 DIAGNOSIS — Z1231 Encounter for screening mammogram for malignant neoplasm of breast: Secondary | ICD-10-CM | POA: Diagnosis not present

## 2019-03-28 DIAGNOSIS — I10 Essential (primary) hypertension: Secondary | ICD-10-CM | POA: Diagnosis not present

## 2019-03-28 DIAGNOSIS — Z79899 Other long term (current) drug therapy: Secondary | ICD-10-CM | POA: Diagnosis not present

## 2019-03-28 DIAGNOSIS — E039 Hypothyroidism, unspecified: Secondary | ICD-10-CM | POA: Diagnosis not present

## 2019-04-11 DIAGNOSIS — I10 Essential (primary) hypertension: Secondary | ICD-10-CM | POA: Diagnosis not present

## 2019-04-11 DIAGNOSIS — E039 Hypothyroidism, unspecified: Secondary | ICD-10-CM | POA: Diagnosis not present

## 2019-04-11 DIAGNOSIS — J449 Chronic obstructive pulmonary disease, unspecified: Secondary | ICD-10-CM | POA: Diagnosis not present

## 2019-06-02 DIAGNOSIS — M79672 Pain in left foot: Secondary | ICD-10-CM | POA: Diagnosis not present

## 2019-06-02 DIAGNOSIS — M79671 Pain in right foot: Secondary | ICD-10-CM | POA: Diagnosis not present

## 2019-06-02 DIAGNOSIS — M722 Plantar fascial fibromatosis: Secondary | ICD-10-CM | POA: Diagnosis not present

## 2019-06-23 DIAGNOSIS — M722 Plantar fascial fibromatosis: Secondary | ICD-10-CM | POA: Diagnosis not present

## 2019-08-25 DIAGNOSIS — M79672 Pain in left foot: Secondary | ICD-10-CM | POA: Diagnosis not present

## 2019-08-25 DIAGNOSIS — M79671 Pain in right foot: Secondary | ICD-10-CM | POA: Diagnosis not present

## 2019-08-25 DIAGNOSIS — M722 Plantar fascial fibromatosis: Secondary | ICD-10-CM | POA: Diagnosis not present

## 2019-08-26 DIAGNOSIS — Z961 Presence of intraocular lens: Secondary | ICD-10-CM | POA: Diagnosis not present

## 2019-09-15 DIAGNOSIS — M79671 Pain in right foot: Secondary | ICD-10-CM | POA: Diagnosis not present

## 2019-09-15 DIAGNOSIS — M79672 Pain in left foot: Secondary | ICD-10-CM | POA: Diagnosis not present

## 2019-09-15 DIAGNOSIS — M722 Plantar fascial fibromatosis: Secondary | ICD-10-CM | POA: Diagnosis not present

## 2019-12-09 DIAGNOSIS — Z79899 Other long term (current) drug therapy: Secondary | ICD-10-CM | POA: Diagnosis not present

## 2019-12-09 DIAGNOSIS — E039 Hypothyroidism, unspecified: Secondary | ICD-10-CM | POA: Diagnosis not present

## 2019-12-09 DIAGNOSIS — I10 Essential (primary) hypertension: Secondary | ICD-10-CM | POA: Diagnosis not present

## 2019-12-09 DIAGNOSIS — E7800 Pure hypercholesterolemia, unspecified: Secondary | ICD-10-CM | POA: Diagnosis not present

## 2019-12-16 DIAGNOSIS — Z1231 Encounter for screening mammogram for malignant neoplasm of breast: Secondary | ICD-10-CM | POA: Diagnosis not present

## 2019-12-16 DIAGNOSIS — Z0000 Encounter for general adult medical examination without abnormal findings: Secondary | ICD-10-CM | POA: Diagnosis not present

## 2019-12-16 DIAGNOSIS — J449 Chronic obstructive pulmonary disease, unspecified: Secondary | ICD-10-CM | POA: Diagnosis not present

## 2019-12-16 DIAGNOSIS — I10 Essential (primary) hypertension: Secondary | ICD-10-CM | POA: Diagnosis not present

## 2019-12-16 DIAGNOSIS — M25552 Pain in left hip: Secondary | ICD-10-CM | POA: Diagnosis not present

## 2019-12-16 DIAGNOSIS — M47816 Spondylosis without myelopathy or radiculopathy, lumbar region: Secondary | ICD-10-CM | POA: Diagnosis not present

## 2019-12-16 DIAGNOSIS — Z79899 Other long term (current) drug therapy: Secondary | ICD-10-CM | POA: Diagnosis not present

## 2019-12-16 DIAGNOSIS — E7800 Pure hypercholesterolemia, unspecified: Secondary | ICD-10-CM | POA: Diagnosis not present

## 2019-12-16 DIAGNOSIS — E039 Hypothyroidism, unspecified: Secondary | ICD-10-CM | POA: Diagnosis not present

## 2019-12-17 ENCOUNTER — Other Ambulatory Visit: Payer: Self-pay | Admitting: Internal Medicine

## 2019-12-17 DIAGNOSIS — Z1231 Encounter for screening mammogram for malignant neoplasm of breast: Secondary | ICD-10-CM

## 2019-12-29 DIAGNOSIS — M79671 Pain in right foot: Secondary | ICD-10-CM | POA: Diagnosis not present

## 2019-12-29 DIAGNOSIS — M722 Plantar fascial fibromatosis: Secondary | ICD-10-CM | POA: Diagnosis not present

## 2019-12-29 DIAGNOSIS — M79672 Pain in left foot: Secondary | ICD-10-CM | POA: Diagnosis not present

## 2020-01-13 DIAGNOSIS — M722 Plantar fascial fibromatosis: Secondary | ICD-10-CM | POA: Diagnosis not present

## 2020-01-13 DIAGNOSIS — M79671 Pain in right foot: Secondary | ICD-10-CM | POA: Diagnosis not present

## 2020-01-13 DIAGNOSIS — M79672 Pain in left foot: Secondary | ICD-10-CM | POA: Diagnosis not present

## 2020-01-20 DIAGNOSIS — B354 Tinea corporis: Secondary | ICD-10-CM | POA: Diagnosis not present

## 2020-01-20 DIAGNOSIS — L900 Lichen sclerosus et atrophicus: Secondary | ICD-10-CM | POA: Diagnosis not present

## 2020-02-20 DIAGNOSIS — D2271 Melanocytic nevi of right lower limb, including hip: Secondary | ICD-10-CM | POA: Diagnosis not present

## 2020-02-20 DIAGNOSIS — D2272 Melanocytic nevi of left lower limb, including hip: Secondary | ICD-10-CM | POA: Diagnosis not present

## 2020-02-20 DIAGNOSIS — D2262 Melanocytic nevi of left upper limb, including shoulder: Secondary | ICD-10-CM | POA: Diagnosis not present

## 2020-02-20 DIAGNOSIS — D2261 Melanocytic nevi of right upper limb, including shoulder: Secondary | ICD-10-CM | POA: Diagnosis not present

## 2020-02-20 DIAGNOSIS — L821 Other seborrheic keratosis: Secondary | ICD-10-CM | POA: Diagnosis not present

## 2020-02-20 DIAGNOSIS — D225 Melanocytic nevi of trunk: Secondary | ICD-10-CM | POA: Diagnosis not present

## 2020-02-27 ENCOUNTER — Other Ambulatory Visit: Payer: Self-pay

## 2020-02-27 ENCOUNTER — Ambulatory Visit
Admission: RE | Admit: 2020-02-27 | Discharge: 2020-02-27 | Disposition: A | Discharge status: Home / Self Care | Payer: Medicare HMO | Source: Ambulatory Visit | Attending: Internal Medicine | Admitting: Internal Medicine

## 2020-02-27 DIAGNOSIS — Z1231 Encounter for screening mammogram for malignant neoplasm of breast: Secondary | ICD-10-CM | POA: Diagnosis not present

## 2020-03-15 DIAGNOSIS — N904 Leukoplakia of vulva: Secondary | ICD-10-CM | POA: Diagnosis not present

## 2020-06-10 DIAGNOSIS — Z79899 Other long term (current) drug therapy: Secondary | ICD-10-CM | POA: Diagnosis not present

## 2020-06-10 DIAGNOSIS — I10 Essential (primary) hypertension: Secondary | ICD-10-CM | POA: Diagnosis not present

## 2020-06-10 DIAGNOSIS — E7800 Pure hypercholesterolemia, unspecified: Secondary | ICD-10-CM | POA: Diagnosis not present

## 2020-06-10 DIAGNOSIS — E039 Hypothyroidism, unspecified: Secondary | ICD-10-CM | POA: Diagnosis not present

## 2020-06-18 DIAGNOSIS — E079 Disorder of thyroid, unspecified: Secondary | ICD-10-CM | POA: Diagnosis not present

## 2020-06-18 DIAGNOSIS — E785 Hyperlipidemia, unspecified: Secondary | ICD-10-CM | POA: Diagnosis not present

## 2020-06-18 DIAGNOSIS — I10 Essential (primary) hypertension: Secondary | ICD-10-CM | POA: Diagnosis not present

## 2020-06-18 DIAGNOSIS — Z79899 Other long term (current) drug therapy: Secondary | ICD-10-CM | POA: Diagnosis not present

## 2020-06-18 DIAGNOSIS — Z0000 Encounter for general adult medical examination without abnormal findings: Secondary | ICD-10-CM | POA: Diagnosis not present

## 2020-07-20 DIAGNOSIS — M722 Plantar fascial fibromatosis: Secondary | ICD-10-CM | POA: Diagnosis not present

## 2020-10-15 DIAGNOSIS — H9313 Tinnitus, bilateral: Secondary | ICD-10-CM | POA: Diagnosis not present

## 2020-10-22 DIAGNOSIS — H9313 Tinnitus, bilateral: Secondary | ICD-10-CM | POA: Diagnosis not present

## 2020-11-09 ENCOUNTER — Other Ambulatory Visit (HOSPITAL_BASED_OUTPATIENT_CLINIC_OR_DEPARTMENT_OTHER): Payer: Self-pay | Admitting: Ophthalmology

## 2020-11-09 DIAGNOSIS — G902 Horner's syndrome: Secondary | ICD-10-CM

## 2020-11-20 ENCOUNTER — Ambulatory Visit (HOSPITAL_BASED_OUTPATIENT_CLINIC_OR_DEPARTMENT_OTHER): Admission: RE | Admit: 2020-11-20 | Payer: Medicare HMO | Source: Ambulatory Visit

## 2020-11-20 ENCOUNTER — Other Ambulatory Visit: Payer: Self-pay

## 2020-11-29 DIAGNOSIS — H903 Sensorineural hearing loss, bilateral: Secondary | ICD-10-CM | POA: Diagnosis not present

## 2020-11-29 DIAGNOSIS — H9313 Tinnitus, bilateral: Secondary | ICD-10-CM | POA: Diagnosis not present

## 2020-12-08 DIAGNOSIS — H9313 Tinnitus, bilateral: Secondary | ICD-10-CM | POA: Diagnosis not present

## 2020-12-09 DIAGNOSIS — I10 Essential (primary) hypertension: Secondary | ICD-10-CM | POA: Diagnosis not present

## 2020-12-09 DIAGNOSIS — Z79899 Other long term (current) drug therapy: Secondary | ICD-10-CM | POA: Diagnosis not present

## 2020-12-09 DIAGNOSIS — E7800 Pure hypercholesterolemia, unspecified: Secondary | ICD-10-CM | POA: Diagnosis not present

## 2020-12-09 DIAGNOSIS — E039 Hypothyroidism, unspecified: Secondary | ICD-10-CM | POA: Diagnosis not present

## 2020-12-16 DIAGNOSIS — Z1231 Encounter for screening mammogram for malignant neoplasm of breast: Secondary | ICD-10-CM | POA: Diagnosis not present

## 2020-12-16 DIAGNOSIS — Z0000 Encounter for general adult medical examination without abnormal findings: Secondary | ICD-10-CM | POA: Diagnosis not present

## 2020-12-16 DIAGNOSIS — I10 Essential (primary) hypertension: Secondary | ICD-10-CM | POA: Diagnosis not present

## 2020-12-16 DIAGNOSIS — M5489 Other dorsalgia: Secondary | ICD-10-CM | POA: Diagnosis not present

## 2020-12-16 DIAGNOSIS — M545 Low back pain, unspecified: Secondary | ICD-10-CM | POA: Diagnosis not present

## 2020-12-16 DIAGNOSIS — E039 Hypothyroidism, unspecified: Secondary | ICD-10-CM | POA: Diagnosis not present

## 2020-12-16 DIAGNOSIS — J449 Chronic obstructive pulmonary disease, unspecified: Secondary | ICD-10-CM | POA: Diagnosis not present

## 2020-12-16 DIAGNOSIS — M5430 Sciatica, unspecified side: Secondary | ICD-10-CM | POA: Diagnosis not present

## 2020-12-16 DIAGNOSIS — E7800 Pure hypercholesterolemia, unspecified: Secondary | ICD-10-CM | POA: Diagnosis not present

## 2020-12-29 DIAGNOSIS — M79604 Pain in right leg: Secondary | ICD-10-CM | POA: Diagnosis not present

## 2020-12-29 DIAGNOSIS — G8929 Other chronic pain: Secondary | ICD-10-CM | POA: Diagnosis not present

## 2020-12-29 DIAGNOSIS — M79605 Pain in left leg: Secondary | ICD-10-CM | POA: Diagnosis not present

## 2020-12-29 DIAGNOSIS — M5442 Lumbago with sciatica, left side: Secondary | ICD-10-CM | POA: Diagnosis not present

## 2020-12-29 DIAGNOSIS — M5441 Lumbago with sciatica, right side: Secondary | ICD-10-CM | POA: Diagnosis not present

## 2021-01-24 ENCOUNTER — Other Ambulatory Visit: Payer: Self-pay | Admitting: Internal Medicine

## 2021-01-24 DIAGNOSIS — Z1231 Encounter for screening mammogram for malignant neoplasm of breast: Secondary | ICD-10-CM

## 2021-02-01 DIAGNOSIS — I10 Essential (primary) hypertension: Secondary | ICD-10-CM | POA: Diagnosis not present

## 2021-02-01 DIAGNOSIS — E039 Hypothyroidism, unspecified: Secondary | ICD-10-CM | POA: Diagnosis not present

## 2021-02-01 DIAGNOSIS — J410 Simple chronic bronchitis: Secondary | ICD-10-CM | POA: Diagnosis not present

## 2021-02-01 DIAGNOSIS — Z23 Encounter for immunization: Secondary | ICD-10-CM | POA: Diagnosis not present

## 2021-02-01 DIAGNOSIS — E7800 Pure hypercholesterolemia, unspecified: Secondary | ICD-10-CM | POA: Diagnosis not present

## 2021-02-02 ENCOUNTER — Other Ambulatory Visit: Payer: Self-pay | Admitting: Internal Medicine

## 2021-02-02 DIAGNOSIS — Z1231 Encounter for screening mammogram for malignant neoplasm of breast: Secondary | ICD-10-CM

## 2021-02-07 ENCOUNTER — Other Ambulatory Visit: Payer: Self-pay | Admitting: Internal Medicine

## 2021-02-07 DIAGNOSIS — M8580 Other specified disorders of bone density and structure, unspecified site: Secondary | ICD-10-CM

## 2021-02-16 DIAGNOSIS — M542 Cervicalgia: Secondary | ICD-10-CM | POA: Diagnosis not present

## 2021-02-21 DIAGNOSIS — L821 Other seborrheic keratosis: Secondary | ICD-10-CM | POA: Diagnosis not present

## 2021-02-21 DIAGNOSIS — D225 Melanocytic nevi of trunk: Secondary | ICD-10-CM | POA: Diagnosis not present

## 2021-02-21 DIAGNOSIS — L820 Inflamed seborrheic keratosis: Secondary | ICD-10-CM | POA: Diagnosis not present

## 2021-02-21 DIAGNOSIS — L718 Other rosacea: Secondary | ICD-10-CM | POA: Diagnosis not present

## 2021-02-21 DIAGNOSIS — D2261 Melanocytic nevi of right upper limb, including shoulder: Secondary | ICD-10-CM | POA: Diagnosis not present

## 2021-02-21 DIAGNOSIS — L900 Lichen sclerosus et atrophicus: Secondary | ICD-10-CM | POA: Diagnosis not present

## 2021-02-21 DIAGNOSIS — D2272 Melanocytic nevi of left lower limb, including hip: Secondary | ICD-10-CM | POA: Diagnosis not present

## 2021-02-21 DIAGNOSIS — D485 Neoplasm of uncertain behavior of skin: Secondary | ICD-10-CM | POA: Diagnosis not present

## 2021-02-21 DIAGNOSIS — D2271 Melanocytic nevi of right lower limb, including hip: Secondary | ICD-10-CM | POA: Diagnosis not present

## 2021-02-21 DIAGNOSIS — D2262 Melanocytic nevi of left upper limb, including shoulder: Secondary | ICD-10-CM | POA: Diagnosis not present

## 2021-03-02 ENCOUNTER — Ambulatory Visit
Admission: RE | Admit: 2021-03-02 | Discharge: 2021-03-02 | Disposition: A | Discharge status: Home / Self Care | Payer: Medicare HMO | Source: Ambulatory Visit | Attending: Internal Medicine | Admitting: Internal Medicine

## 2021-03-02 ENCOUNTER — Other Ambulatory Visit: Payer: Self-pay

## 2021-03-02 DIAGNOSIS — Z780 Asymptomatic menopausal state: Secondary | ICD-10-CM | POA: Insufficient documentation

## 2021-03-02 DIAGNOSIS — Z13820 Encounter for screening for osteoporosis: Secondary | ICD-10-CM | POA: Diagnosis not present

## 2021-03-02 DIAGNOSIS — J449 Chronic obstructive pulmonary disease, unspecified: Secondary | ICD-10-CM | POA: Insufficient documentation

## 2021-03-02 DIAGNOSIS — M810 Age-related osteoporosis without current pathological fracture: Secondary | ICD-10-CM | POA: Diagnosis not present

## 2021-03-02 DIAGNOSIS — Z1231 Encounter for screening mammogram for malignant neoplasm of breast: Secondary | ICD-10-CM | POA: Diagnosis not present

## 2021-03-02 DIAGNOSIS — M8580 Other specified disorders of bone density and structure, unspecified site: Secondary | ICD-10-CM | POA: Insufficient documentation

## 2021-04-27 DIAGNOSIS — Z79899 Other long term (current) drug therapy: Secondary | ICD-10-CM | POA: Diagnosis not present

## 2021-04-27 DIAGNOSIS — R7309 Other abnormal glucose: Secondary | ICD-10-CM | POA: Diagnosis not present

## 2021-04-27 DIAGNOSIS — J449 Chronic obstructive pulmonary disease, unspecified: Secondary | ICD-10-CM | POA: Diagnosis not present

## 2021-04-27 DIAGNOSIS — E7800 Pure hypercholesterolemia, unspecified: Secondary | ICD-10-CM | POA: Diagnosis not present

## 2021-04-27 DIAGNOSIS — I10 Essential (primary) hypertension: Secondary | ICD-10-CM | POA: Diagnosis not present

## 2021-04-27 DIAGNOSIS — E039 Hypothyroidism, unspecified: Secondary | ICD-10-CM | POA: Diagnosis not present

## 2021-05-26 ENCOUNTER — Encounter: Payer: Self-pay | Admitting: Dermatology

## 2021-05-26 ENCOUNTER — Ambulatory Visit: Payer: Medicare Other | Admitting: Dermatology

## 2021-05-26 ENCOUNTER — Other Ambulatory Visit: Payer: Self-pay

## 2021-05-26 DIAGNOSIS — D485 Neoplasm of uncertain behavior of skin: Secondary | ICD-10-CM

## 2021-05-26 DIAGNOSIS — L814 Other melanin hyperpigmentation: Secondary | ICD-10-CM

## 2021-05-26 DIAGNOSIS — L578 Other skin changes due to chronic exposure to nonionizing radiation: Secondary | ICD-10-CM | POA: Diagnosis not present

## 2021-05-26 DIAGNOSIS — L570 Actinic keratosis: Secondary | ICD-10-CM | POA: Diagnosis not present

## 2021-05-26 DIAGNOSIS — D229 Melanocytic nevi, unspecified: Secondary | ICD-10-CM

## 2021-05-26 DIAGNOSIS — L821 Other seborrheic keratosis: Secondary | ICD-10-CM

## 2021-05-26 DIAGNOSIS — D1800 Hemangioma unspecified site: Secondary | ICD-10-CM | POA: Diagnosis not present

## 2021-05-26 DIAGNOSIS — Z86018 Personal history of other benign neoplasm: Secondary | ICD-10-CM

## 2021-05-26 DIAGNOSIS — Z1283 Encounter for screening for malignant neoplasm of skin: Secondary | ICD-10-CM | POA: Diagnosis not present

## 2021-05-26 NOTE — Patient Instructions (Addendum)
Wound Care Instructions  Cleanse wound gently with soap and water once a day then pat dry with clean gauze. Apply a thing coat of Petrolatum (petroleum jelly, "Vaseline") over the wound (unless you have an allergy to this). We recommend that you use a new, sterile tube of Vaseline. Do not pick or remove scabs. Do not remove the yellow or white "healing tissue" from the base of the wound.  Cover the wound with fresh, clean, nonstick gauze and secure with paper tape. You may use Band-Aids in place of gauze and tape if the would is small enough, but would recommend trimming much of the tape off as there is often too much. Sometimes Band-Aids can irritate the skin.  You should call the office for your biopsy report after 1 week if you have not already been contacted.  If you experience any problems, such as abnormal amounts of bleeding, swelling, significant bruising, significant pain, or evidence of infection, please call the office immediately.  Cryotherapy Aftercare  Wash gently with soap and water everyday.   Apply Vaseline and Band-Aid daily until healed.   Prior to procedure, discussed risks of blister formation, small wound, skin dyspigmentation, or rare scar following cryotherapy. Recommend Vaseline ointment to treated areas while healing.  Melanoma ABCDEs  Melanoma is the most dangerous type of skin cancer, and is the leading cause of death from skin disease.  You are more likely to develop melanoma if you: Have light-colored skin, light-colored eyes, or red or blond hair Spend a lot of time in the sun Tan regularly, either outdoors or in a tanning bed Have had blistering sunburns, especially during childhood Have a close family member who has had a melanoma Have atypical moles or large birthmarks  Early detection of melanoma is key since treatment is typically straightforward and cure rates are extremely high if we catch it early.   The first sign of melanoma is often a change in a  mole or a new dark spot.  The ABCDE system is a way of remembering the signs of melanoma.  A for asymmetry:  The two halves do not match. B for border:  The edges of the growth are irregular. C for color:  A mixture of colors are present instead of an even brown color. D for diameter:  Melanomas are usually (but not always) greater than 70mm - the size of a pencil eraser. E for evolution:  The spot keeps changing in size, shape, and color.  Please check your skin once per month between visits. You can use a small mirror in front and a large mirror behind you to keep an eye on the back side or your body.   If you see any new or changing lesions before your next follow-up, please call to schedule a visit.  Please continue daily skin protection including broad spectrum sunscreen SPF 30+ to sun-exposed areas, reapplying every 2 hours as needed when you're outdoors.     If You Need Anything After Your Visit  If you have any questions or concerns for your doctor, please call our main line at [phone] and press option 4 to reach your doctor's medical assistant. If no one answers, please leave a voicemail as directed and we will return your call as soon as possible. Messages left after 4 pm will be answered the following business day.   You may also send [place] a message via [place]. We typically respond to MyChart messages within 1-2 business days.  For prescription refills,  please ask your pharmacy to contact our office. Our fax number is [phone].  If you have an urgent issue when the clinic is closed that cannot wait until the next business day, you can page your doctor at the number below.    Please note that while we do our best to be available for urgent issues outside of office hours, we are not available 24/7.   If you have an urgent issue and are unable to reach [place], you may choose to seek medical care at your doctor's office, retail clinic, urgent care center, or emergency room.  If  you have a medical emergency, please immediately call 911 or go to the emergency department.  Pager Numbers  - Dr. Nehemiah Massed: [phone]  - Dr. Laurence Ferrari: [phone]  - Dr. Nicole Kindred: [phone]  In the event of inclement weather, please call our main line at [phone] for an update on the status of any delays or closures.  Dermatology Medication Tips: Please keep the boxes that topical medications come in in order to help keep track of the instructions about where and how to use these. Pharmacies typically print the medication instructions only on the boxes and not directly on the medication tubes.   If your medication is too expensive, please contact our office at [phone] option 4 or send [place] a message through [place].   We are unable to tell what your co-pay for medications will be in advance as this is different depending on your insurance coverage. However, we may be able to find a substitute medication at lower cost or fill out paperwork to get insurance to cover a needed medication.   If a prior authorization is required to get your medication covered by your insurance company, please allow [place] 1-2 business days to complete this process.  Drug prices often vary depending on where the prescription is filled and some pharmacies may offer cheaper prices.  The website www.goodrx.com contains coupons for medications through different pharmacies. The prices here do not account for what the cost may be with help from insurance (it may be cheaper with your insurance), but the website can give you the price if you did not use any insurance.  - You can print the associated coupon and take it with your prescription to the pharmacy.  - You may also stop by our office during regular business hours and pick up a GoodRx coupon card.  - If you need your prescription sent electronically to a different pharmacy, notify our office through [facility] or by phone at [phone] option  4.     Si Usted Necesita Algo Despus de Su Visita  Tambin puede enviarnos un mensaje a travs de Pharmacist, community. Por lo general respondemos a los mensajes de MyChart en el transcurso de 1 a 2 das hbiles.  Para renovar recetas, por favor pida a su farmacia que se ponga en contacto con nuestra oficina. Harland Dingwall de fax es [place] [phone].  Si tiene un asunto urgente cuando la clnica est cerrada y que no puede esperar hasta el siguiente da hbil, puede llamar/localizar a su doctor(a) al nmero que aparece a continuacin.   Por favor, tenga en cuenta que aunque hacemos todo lo posible para estar disponibles para asuntos urgentes fuera del horario de [place], no estamos disponibles las 24 horas del da, los 7 das de la [place].   Si tiene un problema urgente y no puede comunicarse con nosotros, puede optar por buscar atencin Warden/ranger de  su doctor(a), en una clnica privada, en un centro de atencin urgente o en una sala de emergencias.  Si tiene Engineering geologist, por favor llame inmediatamente al 911 o vaya a la sala de emergencias.  Nmeros de bper  - Dr. Nehemiah Massed: [phone]  - Dra. Moye: [phone]  - Dra. Nicole Kindred: [phone]  En caso de inclemencias del [place], por favor llame a Johnsie Kindred principal al [phone] para una actualizacin sobre el [place] de cualquier retraso o cierre.  Consejos para la medicacin en dermatologa: Por favor, guarde las cajas en las que vienen los medicamentos de uso tpico para ayudarle a seguir las instrucciones sobre dnde y cmo usarlos. Las farmacias generalmente imprimen las instrucciones del medicamento slo en las cajas y no directamente en los tubos del [place].   Si su medicamento es muy caro, por favor, pngase en contacto con Zigmund Daniel llamando al [phone] y presione la opcin 4 o envenos un mensaje a travs de Pharmacist, community.   No podemos decirle cul ser su copago por los medicamentos por  adelantado ya que esto es diferente dependiendo de la cobertura de su seguro. Sin embargo, es posible que podamos encontrar un medicamento sustituto a Electrical engineer un formulario para que el seguro cubra el medicamento que se considera necesario.   Si se requiere una autorizacin previa para que su compaa de seguros [place] su medicamento, por favor permtanos de 1 a 2 das hbiles para completar este proceso.  Los precios de los medicamentos varan con frecuencia dependiendo del Environmental consultant de dnde se surte la receta y alguna farmacias pueden ofrecer precios ms baratos.  El sitio web www.goodrx.com tiene cupones para medicamentos de Airline pilot. Los precios aqu no tienen en cuenta lo que podra costar con la ayuda del seguro (puede ser ms barato con su seguro), pero el sitio web puede darle el precio si no utiliz Research scientist (physical sciences).  - Puede imprimir el cupn correspondiente y llevarlo con su receta a la farmacia.  - Tambin puede pasar por nuestra oficina durante el horario de atencin regular y Charity fundraiser una tarjeta de cupones de GoodRx.  - Si necesita que su receta se enve electrnicamente a una farmacia diferente, informe a nuestra oficina a travs de MyChart de [place] o por telfono llamando al [phone] y presione la opcin 4.   Recommend taking Heliocare sun protection supplement daily in sunny weather for additional sun protection. For maximum protection on the sunniest days, you can take up to 2 capsules of regular Heliocare OR take 1 capsule of Heliocare Ultra. For prolonged exposure (such as a full day in the sun), you can repeat your dose of the supplement 4 hours after your first dose. Heliocare can be purchased at [facility] or at [email or web address].

## 2021-05-26 NOTE — Progress Notes (Signed)
Follow-Up Visit   Subjective  Karen Moon is a 76 y.o. female who presents for the following: FBSE (Patient here for full body skin exam and skin cancer screening. Patient with hx of dysplastic nevus. She does have a spot at left thigh that she has recently noticed but is not sure how long it's been there. Also a spot at chest that sometimes itches. ).  The following portions of the chart were reviewed this encounter and updated as appropriate:   Tobacco   Allergies   Meds   Problems   Med Hx   Surg Hx   Fam Hx       Review of Systems:  No other skin or systemic complaints except as noted in HPI or Assessment and Plan.  Objective  Well appearing patient in no apparent distress; mood and affect are within normal limits.  A full examination was performed including scalp, head, eyes, ears, nose, lips, neck, chest, axillae, abdomen, back, buttocks, bilateral upper extremities, bilateral lower extremities, hands, feet, fingers, toes, fingernails, and toenails. All findings within normal limits unless otherwise noted below.  mid chest 0.6 cm pink papule R/o BCC vs other     left pretibia Erythematous thin papules/macules with gritty scale.     Assessment & Plan  Neoplasm of uncertain behavior of skin mid chest  Skin / nail biopsy Type of biopsy: tangential   Informed consent: discussed and consent obtained   Timeout: patient name, date of birth, surgical site, and procedure verified   Patient was prepped and draped in usual sterile fashion: Area prepped with isopropyl alcohol. Anesthesia: the lesion was anesthetized in a standard fashion   Anesthetic:  1% lidocaine w/ epinephrine 1-100,000 buffered w/ 8.4% NaHCO3 Instrument used: flexible razor blade   Hemostasis achieved with: aluminum chloride   Outcome: patient tolerated procedure well   Post-procedure details: wound care instructions given   Additional details:  Mupirocin and a bandage applied  Specimen 1 -  Surgical pathology Differential Diagnosis: R/o BCC vs other  Check Margins: No 0.6 cm pink papule   AK (actinic keratosis) left pretibia  Prior to procedure, discussed risks of blister formation, small wound, skin dyspigmentation, or rare scar following cryotherapy. Recommend Vaseline ointment to treated areas while healing.   Destruction of lesion - left pretibia  Destruction method: cryotherapy   Informed consent: discussed and consent obtained   Lesion destroyed using liquid nitrogen: Yes   Cryotherapy cycles:  2 Outcome: patient tolerated procedure well with no complications   Post-procedure details: wound care instructions given     Lentigines - Scattered tan macules - Due to sun exposure - Benign-appearing, observe - Recommend daily broad spectrum sunscreen SPF 30+ to sun-exposed areas, reapply every 2 hours as needed. - Call for any changes  Seborrheic Keratoses - Stuck-on, waxy, tan-brown papules and/or plaques  - Benign-appearing - Discussed benign etiology and prognosis. - Observe - Call for any changes  Melanocytic Nevi - Tan-brown and/or pink-flesh-colored symmetric macules and papules - Benign appearing on exam today - Observation - Call clinic for new or changing moles - Recommend daily use of broad spectrum spf 30+ sunscreen to sun-exposed areas.   Hemangiomas - Red papules - Discussed benign nature - Observe - Call for any changes  Actinic Damage - Chronic condition, secondary to cumulative UV/sun exposure - diffuse scaly erythematous macules with underlying dyspigmentation - Recommend daily broad spectrum sunscreen SPF 30+ to sun-exposed areas, reapply every 2 hours as needed.  - Staying  in the shade or wearing long sleeves, sun glasses (UVA+UVB protection) and wide brim hats (4-inch brim around the entire circumference of the hat) are also recommended for sun protection.  - Call for new or changing lesions.  Skin cancer screening performed  today.  History of Dysplastic Nevi - No evidence of recurrence today - Recommend regular full body skin exams - Recommend daily broad spectrum sunscreen SPF 30+ to sun-exposed areas, reapply every 2 hours as needed.  - Call if any new or changing lesions are noted between office visits  Return in about 1 year (around 05/26/2022) for TBSE.  Graciella Belton, RMA, am acting as scribe for Forest Gleason, MD .  Documentation: I have reviewed the above documentation for accuracy and completeness, and I agree with the above.  Forest Gleason, MD

## 2021-05-31 ENCOUNTER — Telehealth: Payer: Self-pay

## 2021-05-31 NOTE — Telephone Encounter (Signed)
Error

## 2021-05-31 NOTE — Telephone Encounter (Signed)
Patient advised of BX results .aw 

## 2021-05-31 NOTE — Telephone Encounter (Signed)
LMOVM for patient to C/B. JP

## 2021-05-31 NOTE — Telephone Encounter (Signed)
-----   Message from Alfonso Patten, MD sent at 05/31/2021 10:49 AM EST ----- Skin , mid chest LICHENOID KERATOSIS --> Inflamed sun spot, no treatment needed  MAs please call. Thank you!

## 2021-06-06 ENCOUNTER — Encounter: Payer: Self-pay | Admitting: Dermatology

## 2021-12-20 ENCOUNTER — Other Ambulatory Visit: Payer: Self-pay | Admitting: Internal Medicine

## 2021-12-20 DIAGNOSIS — Z1231 Encounter for screening mammogram for malignant neoplasm of breast: Secondary | ICD-10-CM

## 2022-03-03 ENCOUNTER — Ambulatory Visit
Admission: RE | Admit: 2022-03-03 | Discharge: 2022-03-03 | Disposition: A | Discharge status: Home / Self Care | Payer: Medicare Other | Source: Ambulatory Visit | Attending: Internal Medicine | Admitting: Internal Medicine

## 2022-03-03 DIAGNOSIS — Z1231 Encounter for screening mammogram for malignant neoplasm of breast: Secondary | ICD-10-CM

## 2022-06-01 ENCOUNTER — Encounter: Payer: Medicare Other | Admitting: Dermatology

## 2022-06-14 DIAGNOSIS — J309 Allergic rhinitis, unspecified: Secondary | ICD-10-CM | POA: Diagnosis not present

## 2022-06-14 DIAGNOSIS — R040 Epistaxis: Secondary | ICD-10-CM | POA: Diagnosis not present

## 2022-06-16 DIAGNOSIS — D2261 Melanocytic nevi of right upper limb, including shoulder: Secondary | ICD-10-CM | POA: Diagnosis not present

## 2022-06-16 DIAGNOSIS — L718 Other rosacea: Secondary | ICD-10-CM | POA: Diagnosis not present

## 2022-06-16 DIAGNOSIS — D2271 Melanocytic nevi of right lower limb, including hip: Secondary | ICD-10-CM | POA: Diagnosis not present

## 2022-06-16 DIAGNOSIS — L900 Lichen sclerosus et atrophicus: Secondary | ICD-10-CM | POA: Diagnosis not present

## 2022-06-16 DIAGNOSIS — D225 Melanocytic nevi of trunk: Secondary | ICD-10-CM | POA: Diagnosis not present

## 2022-06-16 DIAGNOSIS — L821 Other seborrheic keratosis: Secondary | ICD-10-CM | POA: Diagnosis not present

## 2022-06-16 DIAGNOSIS — D2262 Melanocytic nevi of left upper limb, including shoulder: Secondary | ICD-10-CM | POA: Diagnosis not present

## 2022-06-16 DIAGNOSIS — D2272 Melanocytic nevi of left lower limb, including hip: Secondary | ICD-10-CM | POA: Diagnosis not present

## 2022-06-22 DIAGNOSIS — M79675 Pain in left toe(s): Secondary | ICD-10-CM | POA: Diagnosis not present

## 2022-06-22 DIAGNOSIS — M79674 Pain in right toe(s): Secondary | ICD-10-CM | POA: Diagnosis not present

## 2022-06-22 DIAGNOSIS — B351 Tinea unguium: Secondary | ICD-10-CM | POA: Diagnosis not present

## 2022-06-22 DIAGNOSIS — S90111A Contusion of right great toe without damage to nail, initial encounter: Secondary | ICD-10-CM | POA: Diagnosis not present

## 2022-07-03 DIAGNOSIS — R002 Palpitations: Secondary | ICD-10-CM | POA: Diagnosis not present

## 2022-07-24 DIAGNOSIS — H52213 Irregular astigmatism, bilateral: Secondary | ICD-10-CM | POA: Diagnosis not present

## 2022-08-23 DIAGNOSIS — E785 Hyperlipidemia, unspecified: Secondary | ICD-10-CM | POA: Diagnosis not present

## 2022-08-23 DIAGNOSIS — R002 Palpitations: Secondary | ICD-10-CM | POA: Diagnosis not present

## 2022-08-23 DIAGNOSIS — Z79899 Other long term (current) drug therapy: Secondary | ICD-10-CM | POA: Diagnosis not present

## 2022-08-23 DIAGNOSIS — J449 Chronic obstructive pulmonary disease, unspecified: Secondary | ICD-10-CM | POA: Diagnosis not present

## 2022-08-23 DIAGNOSIS — R202 Paresthesia of skin: Secondary | ICD-10-CM | POA: Diagnosis not present

## 2022-08-23 DIAGNOSIS — K219 Gastro-esophageal reflux disease without esophagitis: Secondary | ICD-10-CM | POA: Diagnosis not present

## 2022-08-23 DIAGNOSIS — I10 Essential (primary) hypertension: Secondary | ICD-10-CM | POA: Diagnosis not present

## 2022-08-23 DIAGNOSIS — Z0000 Encounter for general adult medical examination without abnormal findings: Secondary | ICD-10-CM | POA: Diagnosis not present

## 2022-08-23 DIAGNOSIS — E079 Disorder of thyroid, unspecified: Secondary | ICD-10-CM | POA: Diagnosis not present

## 2022-08-24 DIAGNOSIS — R002 Palpitations: Secondary | ICD-10-CM | POA: Diagnosis not present

## 2022-11-15 DIAGNOSIS — E039 Hypothyroidism, unspecified: Secondary | ICD-10-CM | POA: Diagnosis not present

## 2022-11-15 DIAGNOSIS — I10 Essential (primary) hypertension: Secondary | ICD-10-CM | POA: Diagnosis not present

## 2022-11-15 DIAGNOSIS — J4489 Other specified chronic obstructive pulmonary disease: Secondary | ICD-10-CM | POA: Diagnosis not present

## 2022-11-15 DIAGNOSIS — E7800 Pure hypercholesterolemia, unspecified: Secondary | ICD-10-CM | POA: Diagnosis not present

## 2022-11-15 DIAGNOSIS — Z79899 Other long term (current) drug therapy: Secondary | ICD-10-CM | POA: Diagnosis not present

## 2022-12-19 DIAGNOSIS — M79675 Pain in left toe(s): Secondary | ICD-10-CM | POA: Diagnosis not present

## 2022-12-19 DIAGNOSIS — M79674 Pain in right toe(s): Secondary | ICD-10-CM | POA: Diagnosis not present

## 2022-12-19 DIAGNOSIS — B351 Tinea unguium: Secondary | ICD-10-CM | POA: Diagnosis not present

## 2023-01-02 ENCOUNTER — Other Ambulatory Visit: Payer: Self-pay | Admitting: Internal Medicine

## 2023-01-02 DIAGNOSIS — Z1231 Encounter for screening mammogram for malignant neoplasm of breast: Secondary | ICD-10-CM

## 2023-02-08 DIAGNOSIS — I10 Essential (primary) hypertension: Secondary | ICD-10-CM | POA: Diagnosis not present

## 2023-02-08 DIAGNOSIS — E7800 Pure hypercholesterolemia, unspecified: Secondary | ICD-10-CM | POA: Diagnosis not present

## 2023-02-08 DIAGNOSIS — E039 Hypothyroidism, unspecified: Secondary | ICD-10-CM | POA: Diagnosis not present

## 2023-02-08 DIAGNOSIS — Z79899 Other long term (current) drug therapy: Secondary | ICD-10-CM | POA: Diagnosis not present

## 2023-02-15 DIAGNOSIS — Z79899 Other long term (current) drug therapy: Secondary | ICD-10-CM | POA: Diagnosis not present

## 2023-02-15 DIAGNOSIS — E7800 Pure hypercholesterolemia, unspecified: Secondary | ICD-10-CM | POA: Diagnosis not present

## 2023-02-15 DIAGNOSIS — Z0000 Encounter for general adult medical examination without abnormal findings: Secondary | ICD-10-CM | POA: Diagnosis not present

## 2023-02-15 DIAGNOSIS — E039 Hypothyroidism, unspecified: Secondary | ICD-10-CM | POA: Diagnosis not present

## 2023-02-15 DIAGNOSIS — I10 Essential (primary) hypertension: Secondary | ICD-10-CM | POA: Diagnosis not present

## 2023-03-06 ENCOUNTER — Ambulatory Visit
Admission: RE | Admit: 2023-03-06 | Discharge: 2023-03-06 | Disposition: A | Discharge status: Home / Self Care | Payer: Medicare HMO | Source: Ambulatory Visit | Attending: Internal Medicine | Admitting: Internal Medicine

## 2023-03-06 DIAGNOSIS — Z1231 Encounter for screening mammogram for malignant neoplasm of breast: Secondary | ICD-10-CM | POA: Insufficient documentation

## 2023-03-15 DIAGNOSIS — Z860101 Personal history of adenomatous and serrated colon polyps: Secondary | ICD-10-CM | POA: Diagnosis not present

## 2023-03-15 DIAGNOSIS — K219 Gastro-esophageal reflux disease without esophagitis: Secondary | ICD-10-CM | POA: Diagnosis not present

## 2023-03-15 DIAGNOSIS — K5900 Constipation, unspecified: Secondary | ICD-10-CM | POA: Diagnosis not present

## 2023-04-27 DIAGNOSIS — L659 Nonscarring hair loss, unspecified: Secondary | ICD-10-CM | POA: Diagnosis not present

## 2023-05-10 DIAGNOSIS — E039 Hypothyroidism, unspecified: Secondary | ICD-10-CM | POA: Diagnosis not present

## 2023-05-10 DIAGNOSIS — I10 Essential (primary) hypertension: Secondary | ICD-10-CM | POA: Diagnosis not present

## 2023-05-10 DIAGNOSIS — Z79899 Other long term (current) drug therapy: Secondary | ICD-10-CM | POA: Diagnosis not present

## 2023-05-17 DIAGNOSIS — I10 Essential (primary) hypertension: Secondary | ICD-10-CM | POA: Diagnosis not present

## 2023-05-17 DIAGNOSIS — E039 Hypothyroidism, unspecified: Secondary | ICD-10-CM | POA: Diagnosis not present

## 2023-05-17 DIAGNOSIS — R002 Palpitations: Secondary | ICD-10-CM | POA: Diagnosis not present

## 2023-05-17 DIAGNOSIS — Z79899 Other long term (current) drug therapy: Secondary | ICD-10-CM | POA: Diagnosis not present

## 2023-05-17 DIAGNOSIS — E7800 Pure hypercholesterolemia, unspecified: Secondary | ICD-10-CM | POA: Diagnosis not present

## 2023-05-17 DIAGNOSIS — Z0000 Encounter for general adult medical examination without abnormal findings: Secondary | ICD-10-CM | POA: Diagnosis not present

## 2023-05-17 DIAGNOSIS — M810 Age-related osteoporosis without current pathological fracture: Secondary | ICD-10-CM | POA: Diagnosis not present

## 2023-05-17 DIAGNOSIS — J4489 Other specified chronic obstructive pulmonary disease: Secondary | ICD-10-CM | POA: Diagnosis not present

## 2023-05-22 ENCOUNTER — Other Ambulatory Visit (HOSPITAL_COMMUNITY): Payer: Self-pay

## 2023-05-31 ENCOUNTER — Encounter: Payer: Self-pay | Admitting: Gastroenterology

## 2023-06-06 NOTE — H&P (Signed)
Pre-Procedure H&P   Patient ID: Karen Moon is a 78 y.o. female.  Gastroenterology Provider: [name], DO  Referring Provider: Fransico Setters, NP PCP: Marguarite Arbour, MD  Date: 06/07/2023  HPI Karen Moon is a 78 y.o. female who presents today for Colonoscopy for Personal history of colon polyps .  Patient last underwent EGD and colonoscopy November 20,019 demonstrating 1 adenomatous polyp, internal hemorrhoids LA grade B esophagitis and gastritis with upper biopsies negative for H. pylori, Barrett's esophagus and celiac disease.  He was noted to have a redundant/looping colon at that time  No family history of colon cancer or colon polyps  Patient deals with constipation without melena or hematochezia  Hemoglobin 15.4 MCV 88 platelets 287,000 creatinine 0.8   Past Medical History:  Diagnosis Date   Allergic rhinitis    Benign neoplasm of ascending colon    Common cold    COPD (chronic obstructive pulmonary disease) (HCC)    Depression    Dysplastic nevus 05/09/2018   Mild atypia, deep margin involved. Left chest.   GERD (gastroesophageal reflux disease)    Headache    Hyperlipemia    Hypertension    Hypothyroidism    Insomnia    Osteoarthritis    Osteoporosis    Seizure (HCC)    Vaginal dryness, menopausal    Vaginal vault prolapse    ANTERIOR    Past Surgical History:  Procedure Laterality Date   CATARACT EXTRACTION W/ INTRAOCULAR LENS IMPLANT Left 05/09/2011   COLONOSCOPY WITH PROPOFOL N/A 01/26/2015   Procedure: COLONOSCOPY WITH PROPOFOL;  Surgeon: Midge Minium, MD;  Location: [facility] ENDOSCOPY;  Service: Endoscopy;  Laterality: N/A;   COLONOSCOPY WITH PROPOFOL N/A 03/13/2018   Procedure: COLONOSCOPY WITH PROPOFOL;  Surgeon: Scot Jun, MD;  Location: [facility] ENDOSCOPY;  Service: Endoscopy;  Laterality: N/A;   CYSTOSCOPY N/A 07/15/2012   Procedure: CYSTOSCOPY;  Surgeon: Kathi Ludwig, MD;  Location: [facility];  Service: Urology;  Laterality: N/A;   ESOPHAGOGASTRODUODENOSCOPY (EGD) WITH PROPOFOL N/A 03/13/2018   Procedure: ESOPHAGOGASTRODUODENOSCOPY (EGD) WITH PROPOFOL;  Surgeon: Scot Jun, MD;  Location: [facility] ENDOSCOPY;  Service: Endoscopy;  Laterality: N/A;   EYE SURGERY     MILK GLAND EXCISED     MULTIPLE TOOTH EXTRACTIONS  05/08/2002   ORAL MD OFFICE   PUBOVAGINAL SLING N/A 07/15/2012   Procedure: Leonides Grills;  Surgeon: Kathi Ludwig, MD;  Location: [facility];  Service: Urology;  Laterality: N/A;  UPHOLD LITE ANTERIOR VAULT REPAIR SOLYX SLING    Family History No h/o GI disease or malignancy  Review of Systems  Constitutional:  Negative for activity change, appetite change, chills, diaphoresis, fatigue, fever and unexpected weight change.  HENT:  Negative for trouble swallowing and voice change.   Respiratory:  Negative for shortness of breath and wheezing.   Cardiovascular:  Negative for chest pain, palpitations and leg swelling.  Gastrointestinal:  Positive for constipation. Negative for abdominal distention, abdominal pain, anal bleeding, blood in stool, diarrhea, nausea, rectal pain and vomiting.  Musculoskeletal:  Negative for arthralgias and myalgias.  Skin:  Negative for color change and pallor.  Neurological:  Negative for dizziness, syncope and weakness.  Psychiatric/Behavioral:  Negative for confusion.   All other systems reviewed and are negative.    Medications No current facility-administered medications on file prior to encounter.   Current Outpatient Medications on File Prior to Encounter  Medication Sig Dispense Refill   levothyroxine (SYNTHROID, LEVOTHROID)  50 MCG tablet Take 50 mcg PO empty stomach in the am daily. 90 tablet 1   methocarbamol (ROBAXIN) 500 MG tablet Take 500 mg by mouth 2 (two) times daily.     traZODone (DESYREL) 50 MG tablet Take 50 mg by mouth at bedtime as needed for sleep.     aspirin EC 81 MG  tablet Take 1 tablet by mouth daily.     Biotin 1000 MCG tablet Take 1 tablet by mouth daily.     buPROPion (WELLBUTRIN SR) 150 MG 12 hr tablet Take 1 tablet by mouth two  times daily 180 tablet 2   calcium carbonate 1250 MG capsule Take 1,250 mg by mouth 2 (two) times daily with a meal.     Cholecalciferol (VITAMIN D3) 2000 UNITS capsule Take 1 capsule by mouth daily.     Clobetasol Prop Emollient Base 0.05 % emollient cream Apply 1 application topically 2 (two) times daily. 15 g 1   fluticasone (FLONASE) 50 MCG/ACT nasal spray Place 2 sprays into both nostrils daily. 48 g 1   Multiple Vitamins-Minerals (MULTIVITAL PLATINUM SILVER PO) Take 1 tablet by mouth daily.     Omega 3-6-9 CAPS Take 1 capsule by mouth daily.     omeprazole-sodium bicarbonate (ZEGERID) 40-1100 MG capsule Take 1 capsule by mouth daily before breakfast.     pravastatin (PRAVACHOL) 40 MG tablet Take 1 tablet by mouth at  bedtime 90 tablet 2    Pertinent medications related to GI and procedure were reviewed by me with the patient prior to the procedure   Current Facility-Administered Medications:    0.9 %  sodium chloride infusion, , Intravenous, Continuous, [name], DO, Last Rate: 20 mL/hr at 06/07/23 1331, New Bag at 06/07/23 1331  sodium chloride 20 mL/hr at 06/07/23 1331       Allergies  Allergen Reactions   Pantoprazole    Allergies were reviewed by me prior to the procedure  Objective   Body mass index is 24.18 kg/m. Vitals:   06/07/23 1307  BP: 122/73  Pulse: 69  Resp: 16  Temp: (!) 97.3 F (36.3 C)  TempSrc: Temporal  SpO2: 100%  Weight: 56.2 kg  Height: 5' (1.524 m)     Physical Exam Vitals and nursing note reviewed.  Constitutional:      General: She is not in acute distress.    Appearance: Normal appearance. She is not ill-appearing, toxic-appearing or diaphoretic.  HENT:     Head: Normocephalic and atraumatic.     Nose: Nose normal.     Mouth/Throat:     Mouth:  Mucous membranes are moist.     Pharynx: Oropharynx is clear.  Eyes:     General: No scleral icterus.    Extraocular Movements: Extraocular movements intact.  Cardiovascular:     Rate and Rhythm: Normal rate and regular rhythm.     Heart sounds: Normal heart sounds. No murmur heard.    No friction rub. No gallop.  Pulmonary:     Effort: Pulmonary effort is normal. No respiratory distress.     Breath sounds: Normal breath sounds. No wheezing, rhonchi or rales.  Abdominal:     General: Bowel sounds are normal. There is no distension.     Palpations: Abdomen is soft.     Tenderness: There is no abdominal tenderness. There is no guarding or rebound.  Musculoskeletal:     Cervical back: Neck supple.     Right lower leg: No edema.  Left lower leg: No edema.  Skin:    General: Skin is warm and dry.     Coloration: Skin is not jaundiced or pale.  Neurological:     General: No focal deficit present.     Mental Status: She is alert and oriented to person, place, and time. Mental status is at baseline.  Psychiatric:        Mood and Affect: Mood normal.        Behavior: Behavior normal.        Thought Content: Thought content normal.        Judgment: Judgment normal.      Assessment:  Ms. SHADANA PRY is a 78 y.o. female  who presents today for Colonoscopy for Personal history of colon polyps .  Plan:  Colonoscopy with possible intervention today  Colonoscopy with possible biopsy, control of bleeding, polypectomy, and interventions as necessary has been discussed with the patient/patient representative. Informed consent was obtained from the patient/patient representative after explaining the indication, nature, and risks of the procedure including but not limited to death, bleeding, perforation, missed neoplasm/lesions, cardiorespiratory compromise, and reaction to medications. Opportunity for questions was given and appropriate answers were provided. Patient/patient  representative has verbalized understanding is amenable to undergoing the procedure.   [name], DO  [facility] Gastroenterology  Portions of the record may have been created with voice recognition software. Occasional wrong-word or 'sound-a-like' substitutions may have occurred due to the inherent limitations of voice recognition software.  Read the chart carefully and recognize, using context, where substitutions may have occurred.

## 2023-06-07 ENCOUNTER — Ambulatory Visit: Payer: PPO | Admitting: General Practice

## 2023-06-07 ENCOUNTER — Ambulatory Visit
Admission: RE | Admit: 2023-06-07 | Discharge: 2023-06-07 | Disposition: A | Discharge status: Home / Self Care | Payer: PPO | Attending: Gastroenterology | Admitting: Gastroenterology

## 2023-06-07 ENCOUNTER — Encounter
Admission: RE | Disposition: A | Discharge status: Home / Self Care | Payer: Self-pay | Source: Home / Self Care | Attending: Gastroenterology

## 2023-06-07 ENCOUNTER — Encounter: Payer: Self-pay | Admitting: Gastroenterology

## 2023-06-07 DIAGNOSIS — J449 Chronic obstructive pulmonary disease, unspecified: Secondary | ICD-10-CM | POA: Insufficient documentation

## 2023-06-07 DIAGNOSIS — K2100 Gastro-esophageal reflux disease with esophagitis, without bleeding: Secondary | ICD-10-CM | POA: Insufficient documentation

## 2023-06-07 DIAGNOSIS — E039 Hypothyroidism, unspecified: Secondary | ICD-10-CM | POA: Diagnosis not present

## 2023-06-07 DIAGNOSIS — F32A Depression, unspecified: Secondary | ICD-10-CM | POA: Diagnosis not present

## 2023-06-07 DIAGNOSIS — D123 Benign neoplasm of transverse colon: Secondary | ICD-10-CM | POA: Diagnosis not present

## 2023-06-07 DIAGNOSIS — I10 Essential (primary) hypertension: Secondary | ICD-10-CM | POA: Insufficient documentation

## 2023-06-07 DIAGNOSIS — K635 Polyp of colon: Secondary | ICD-10-CM | POA: Diagnosis not present

## 2023-06-07 DIAGNOSIS — Z87891 Personal history of nicotine dependence: Secondary | ICD-10-CM | POA: Diagnosis not present

## 2023-06-07 DIAGNOSIS — Z79890 Hormone replacement therapy (postmenopausal): Secondary | ICD-10-CM | POA: Diagnosis not present

## 2023-06-07 DIAGNOSIS — F419 Anxiety disorder, unspecified: Secondary | ICD-10-CM | POA: Insufficient documentation

## 2023-06-07 DIAGNOSIS — Z7982 Long term (current) use of aspirin: Secondary | ICD-10-CM | POA: Insufficient documentation

## 2023-06-07 DIAGNOSIS — E785 Hyperlipidemia, unspecified: Secondary | ICD-10-CM | POA: Diagnosis not present

## 2023-06-07 DIAGNOSIS — Z860101 Personal history of adenomatous and serrated colon polyps: Secondary | ICD-10-CM | POA: Diagnosis not present

## 2023-06-07 DIAGNOSIS — Z79899 Other long term (current) drug therapy: Secondary | ICD-10-CM | POA: Insufficient documentation

## 2023-06-07 DIAGNOSIS — D120 Benign neoplasm of cecum: Secondary | ICD-10-CM | POA: Insufficient documentation

## 2023-06-07 DIAGNOSIS — Z1211 Encounter for screening for malignant neoplasm of colon: Secondary | ICD-10-CM | POA: Diagnosis not present

## 2023-06-07 HISTORY — DX: Essential (primary) hypertension: I10

## 2023-06-07 HISTORY — PX: COLONOSCOPY WITH PROPOFOL: SHX5780

## 2023-06-07 HISTORY — DX: Menopausal and female climacteric states: N95.1

## 2023-06-07 HISTORY — PX: POLYPECTOMY: SHX5525

## 2023-06-07 HISTORY — DX: Allergic rhinitis, unspecified: J30.9

## 2023-06-07 HISTORY — DX: Benign neoplasm of ascending colon: D12.2

## 2023-06-07 HISTORY — DX: Chronic obstructive pulmonary disease, unspecified: J44.9

## 2023-06-07 HISTORY — DX: Unspecified osteoarthritis, unspecified site: M19.90

## 2023-06-07 SURGERY — COLONOSCOPY WITH PROPOFOL
Anesthesia: General

## 2023-06-07 MED ORDER — PROPOFOL 500 MG/50ML IV EMUL
INTRAVENOUS | Status: DC | PRN
  Administered 2023-06-07: 50 ug/kg/min via INTRAVENOUS

## 2023-06-07 MED ORDER — LIDOCAINE HCL (PF) 2 % IJ SOLN
INTRAMUSCULAR | Status: AC
  Filled 2023-06-07: qty 5

## 2023-06-07 MED ORDER — LIDOCAINE HCL (CARDIAC) PF 100 MG/5ML IV SOSY
PREFILLED_SYRINGE | INTRAVENOUS | Status: DC | PRN
  Administered 2023-06-07: 50 mg via INTRAVENOUS

## 2023-06-07 MED ORDER — SODIUM CHLORIDE 0.9 % IV SOLN: INTRAVENOUS | Status: DC

## 2023-06-07 MED ORDER — PROPOFOL 10 MG/ML IV BOLUS
INTRAVENOUS | Status: DC | PRN
  Administered 2023-06-07: 10 mg via INTRAVENOUS
  Administered 2023-06-07 (×2): 20 mg via INTRAVENOUS

## 2023-06-07 MED ORDER — GLYCOPYRROLATE 0.2 MG/ML IJ SOLN
INTRAMUSCULAR | Status: AC
  Filled 2023-06-07: qty 1

## 2023-06-07 NOTE — Op Note (Signed)
[facility] Gastroenterology Patient Name: Karen Moon Procedure Date: 06/07/2023 2:20 PM MRN: [id number] Account #: [id number] Date of Birth: [birth year] Admit Type: Outpatient Age: 78 Room: [facility] ENDO ROOM 1 Gender: Female Note Status: Finalized Instrument Name: Peds Colonoscope [id number] Procedure:             Colonoscopy Indications:           High risk colon cancer surveillance: Personal history                         of colonic polyps Providers:             Trenda Moots, DO Referring MD:          Duane Lope. Judithann Sheen, MD (Referring MD) Medicines:             Monitored Anesthesia Care Complications:         No immediate complications. Estimated blood loss:                         Minimal. Procedure:             Pre-Anesthesia Assessment:                        - Prior to the procedure, a History and Physical was                         performed, and patient medications and allergies were                         reviewed. The patient is competent. The risks and                         benefits of the procedure and the sedation options and                         risks were discussed with the patient. All questions                         were answered and informed consent was obtained.                         Patient identification and proposed procedure were                         verified by the physician, the nurse, the anesthetist                         and the technician in the endoscopy suite. Mental                         Status Examination: alert and oriented. Airway                         Examination: normal oropharyngeal airway and neck                         mobility. Respiratory Examination: clear to  auscultation. CV Examination: RRR, no murmurs, no S3                         or S4. Prophylactic Antibiotics: The patient does not                         require prophylactic antibiotics. Prior                          Anticoagulants: The patient has taken no anticoagulant                         or antiplatelet agents. ASA Grade Assessment: III - A                         patient with severe systemic disease. After reviewing                         the risks and benefits, the patient was deemed in                         satisfactory condition to undergo the procedure. The                         anesthesia plan was to use monitored anesthesia care                         (MAC). Immediately prior to administration of                         medications, the patient was re-assessed for adequacy                         to receive sedatives. The heart rate, respiratory                         rate, oxygen saturations, blood pressure, adequacy of                         pulmonary ventilation, and response to care were                         monitored throughout the procedure. The physical                         status of the patient was re-assessed after the                         procedure.                        After obtaining informed consent, the colonoscope was                         passed under direct vision. Throughout the procedure,                         the patient's blood pressure, pulse, and oxygen  saturations were monitored continuously. The                         Colonoscope was introduced through the anus and                         advanced to the the terminal ileum, with                         identification of the appendiceal orifice and IC                         valve. The colonoscopy was performed without                         difficulty. The patient tolerated the procedure well.                         The quality of the bowel preparation was evaluated                         using the BBPS [facility] Bowel Preparation Scale) with                         scores of: Right Colon = 3, Transverse Colon = 3 and                         Left Colon = 3  (entire mucosa seen well with no                         residual staining, small fragments of stool or opaque                         liquid). The total BBPS score equals 9. The terminal                         ileum, ileocecal valve, appendiceal orifice, and                         rectum were photographed. Findings:      The perianal and digital rectal examinations were normal. Pertinent       negatives include normal sphincter tone.      The terminal ileum appeared normal. Estimated blood loss: none.      Five sessile polyps were found in the sigmoid colon, descending colon,       transverse colon (2) and cecum. The polyps were 1 to 2 mm in size. These       polyps were removed with a jumbo cold forceps. Resection and retrieval       were complete. Estimated blood loss was minimal.      The exam was otherwise without abnormality on direct and retroflexion       views. Impression:            - The examined portion of the ileum was normal.                        - Five 1 to 2 mm polyps in the sigmoid colon, in  the                         descending colon, in the transverse colon and in the                         cecum, removed with a jumbo cold forceps. Resected and                         retrieved.                        - The examination was otherwise normal on direct and                         retroflexion views. Recommendation:        - Patient has a contact number available for                         emergencies. The signs and symptoms of potential                         delayed complications were discussed with the patient.                         Return to normal activities tomorrow. Written                         discharge instructions were provided to the patient.                        - Discharge patient to home.                        - Resume previous diet.                        - Continue present medications.                        - Await pathology results.                         - Return to referring physician as previously                         scheduled.                        - The findings and recommendations were discussed with                         the patient. Procedure Code(s):     --- Professional ---                        [phone], Colonoscopy, flexible; with biopsy, single or                         multiple Diagnosis Code(s):     --- Professional ---  Z86.010, Personal history of colonic polyps                        D12.5, Benign neoplasm of sigmoid colon                        D12.4, Benign neoplasm of descending colon                        D12.3, Benign neoplasm of transverse colon (hepatic                         flexure or splenic flexure)                        D12.0, Benign neoplasm of cecum CPT copyright 2022 American Medical Association. All rights reserved. The codes documented in this report are preliminary and upon coder review may  be revised to meet current compliance requirements. Attending Participation:      I personally performed the entire procedure. Elfredia Nevins, DO [name] DO, DO 06/07/2023 3:00:13 PM This report has been signed electronically. Number of Addenda: 0 Note Initiated On: 06/07/2023 2:20 PM Scope Withdrawal Time: 0 hours 11 minutes 17 seconds  Total Procedure Duration: 0 hours 18 minutes 54 seconds  Estimated Blood Loss:  Estimated blood loss was minimal.      [facility]

## 2023-06-07 NOTE — Interval H&P Note (Signed)
History and Physical Interval Note: Preprocedure H&P from 06/07/23  was reviewed and there was no interval change after seeing and examining the patient.  Written consent was obtained from the patient after discussion of risks, benefits, and alternatives. Patient has consented to proceed with Colonoscopy with possible intervention   06/07/2023 2:24 PM  Karen Moon  has presented today for surgery, with the diagnosis of Z86.0101 (ICD-10-CM) - Hx of adenomatous colonic polyps K59.00 (ICD-10-CM) - Constipation, unspecified constipation type.  The various methods of treatment have been discussed with the patient and family. After consideration of risks, benefits and other options for treatment, the patient has consented to  Procedure(s): COLONOSCOPY WITH PROPOFOL (N/A) as a surgical intervention.  The patient's history has been reviewed, patient examined, no change in status, stable for surgery.  I have reviewed the patient's chart and labs.  Questions were answered to the patient's satisfaction.     [name]

## 2023-06-07 NOTE — Transfer of Care (Signed)
Immediate Anesthesia Transfer of Care Note  Patient: Karen Moon  Procedure(s) Performed: COLONOSCOPY WITH PROPOFOL POLYPECTOMY  Patient Location: Endoscopy Unit  Anesthesia Type:General  Level of Consciousness: awake, alert , and oriented  Airway & Oxygen Therapy: Patient Spontanous Breathing  Post-op Assessment: Report given to RN and Post -op Vital signs reviewed and stable  Post vital signs: Reviewed and stable  Last Vitals:  Vitals Value Taken Time  BP 104/63 06/07/23 1500  Temp    Pulse 60 06/07/23 1500  Resp 18 06/07/23 1500  SpO2 98 % 06/07/23 1500    Last Pain:  Vitals:   06/07/23 1307  TempSrc: Temporal  PainSc: 0-No pain         Complications: No notable events documented.

## 2023-06-07 NOTE — Anesthesia Preprocedure Evaluation (Signed)
Anesthesia Evaluation  Patient identified by MRN, date of birth, ID band Patient awake    Reviewed: Allergy & Precautions, H&P , NPO status , Patient's Chart, lab work & pertinent test results, reviewed documented beta blocker date and time   Airway Mallampati: III  TM Distance: >3 FB Neck ROM: full    Dental  (+) Chipped, Dental Advidsory Given   Pulmonary COPD,  COPD inhaler, former smoker   Pulmonary exam normal        Cardiovascular Exercise Tolerance: Good hypertension, negative cardio ROS Normal cardiovascular exam Rhythm:regular Rate:Normal     Neuro/Psych  PSYCHIATRIC DISORDERS Anxiety Depression    negative neurological ROS     GI/Hepatic Neg liver ROS,GERD  Medicated and Controlled,,  Endo/Other  Hypothyroidism    Renal/GU negative Renal ROS  negative genitourinary   Musculoskeletal   Abdominal   Peds  Hematology negative hematology ROS (+)   Anesthesia Other Findings Past Medical History: No date: Allergic rhinitis No date: Benign neoplasm of ascending colon No date: Common cold No date: COPD (chronic obstructive pulmonary disease) (HCC) No date: Depression 05/09/2018: Dysplastic nevus     Comment:  Mild atypia, deep margin involved. Left chest. No date: GERD (gastroesophageal reflux disease) No date: Headache No date: Hyperlipemia No date: Hypertension No date: Hypothyroidism No date: Insomnia No date: Osteoarthritis No date: Osteoporosis No date: Seizure (HCC) No date: Vaginal dryness, menopausal No date: Vaginal vault prolapse     Comment:  ANTERIOR  Past Surgical History: 05/09/2011: CATARACT EXTRACTION W/ INTRAOCULAR LENS IMPLANT; Left 01/26/2015: COLONOSCOPY WITH PROPOFOL; N/A     Comment:  Procedure: COLONOSCOPY WITH PROPOFOL;  Surgeon: Midge Minium, MD;  Location: [facility] ENDOSCOPY;  Service: Endoscopy;              Laterality: N/A; 03/13/2018: COLONOSCOPY WITH PROPOFOL;  N/A     Comment:  Procedure: COLONOSCOPY WITH PROPOFOL;  Surgeon: Scot Jun, MD;  Location: [facility] ENDOSCOPY;  Service:               Endoscopy;  Laterality: N/A; 07/15/2012: CYSTOSCOPY; N/A     Comment:  Procedure: CYSTOSCOPY;  Surgeon: Kathi Ludwig,               MD;  Location: [facility];  Service:               Urology;  Laterality: N/A; 03/13/2018: ESOPHAGOGASTRODUODENOSCOPY (EGD) WITH PROPOFOL; N/A     Comment:  Procedure: ESOPHAGOGASTRODUODENOSCOPY (EGD) WITH               PROPOFOL;  Surgeon: Scot Jun, MD;  Location:               [facility] ENDOSCOPY;  Service: Endoscopy;  Laterality: N/A; No date: EYE SURGERY No date: MILK GLAND EXCISED 05/08/2002: MULTIPLE TOOTH EXTRACTIONS     Comment:  ORAL MD OFFICE 07/15/2012: PUBOVAGINAL SLING; N/A     Comment:  Procedure: Leonides Grills;  Surgeon: Kathi Ludwig, MD;  Location: [facility] [place];                Service: Urology;  Laterality: N/A;  UPHOLD LITE ANTERIOR              VAULT REPAIR SOLYX SLING  BMI    Body Mass Index: 24.18 kg/m      Reproductive/Obstetrics negative OB ROS                             Anesthesia Physical Anesthesia Plan  ASA: 3  Anesthesia Plan: General   Post-op Pain Management: Minimal or no pain anticipated   Induction: Intravenous  PONV Risk Score and Plan: 3 and Propofol infusion, TIVA and Ondansetron  Airway Management Planned: Nasal Cannula  Additional Equipment: None  Intra-op Plan:   Post-operative Plan:   Informed Consent: I have reviewed the patients History and Physical, chart, labs and discussed the procedure including the risks, benefits and alternatives for the proposed anesthesia with the patient or authorized representative who has indicated his/her understanding and acceptance.     Dental advisory given  Plan Discussed with: CRNA and Surgeon  Anesthesia Plan  Comments: (Discussed risks of anesthesia with patient, including possibility of difficulty with spontaneous ventilation under anesthesia necessitating airway intervention, PONV, and rare risks such as cardiac or respiratory or neurological events, and allergic reactions. Discussed the role of CRNA in patient's perioperative care. Patient understands.)       Anesthesia Quick Evaluation

## 2023-06-08 LAB — SURGICAL PATHOLOGY

## 2023-06-08 NOTE — Anesthesia Postprocedure Evaluation (Signed)
Anesthesia Post Note  Patient: Karen Moon  Procedure(s) Performed: COLONOSCOPY WITH PROPOFOL POLYPECTOMY  Patient location during evaluation: Endoscopy Anesthesia Type: General Level of consciousness: awake and alert Pain management: pain level controlled Vital Signs Assessment: post-procedure vital signs reviewed and stable Respiratory status: spontaneous breathing, nonlabored ventilation, respiratory function stable and patient connected to nasal cannula oxygen Cardiovascular status: blood pressure returned to baseline and stable Postop Assessment: no apparent nausea or vomiting Anesthetic complications: no   No notable events documented.   Last Vitals:  Vitals:   06/07/23 1510 06/07/23 1514  BP: 124/74 (!) 142/72  Pulse:  (!) 128  Resp:    Temp:    SpO2:  (!) 87%    Last Pain:  Vitals:   06/07/23 1307  TempSrc: Temporal  PainSc: 0-No pain                 Lenard Simmer

## 2023-06-11 ENCOUNTER — Encounter: Payer: Self-pay | Admitting: Gastroenterology

## 2023-07-10 DIAGNOSIS — D225 Melanocytic nevi of trunk: Secondary | ICD-10-CM | POA: Diagnosis not present

## 2023-07-10 DIAGNOSIS — D2272 Melanocytic nevi of left lower limb, including hip: Secondary | ICD-10-CM | POA: Diagnosis not present

## 2023-07-10 DIAGNOSIS — L821 Other seborrheic keratosis: Secondary | ICD-10-CM | POA: Diagnosis not present

## 2023-07-10 DIAGNOSIS — D2261 Melanocytic nevi of right upper limb, including shoulder: Secondary | ICD-10-CM | POA: Diagnosis not present

## 2023-07-10 DIAGNOSIS — D2271 Melanocytic nevi of right lower limb, including hip: Secondary | ICD-10-CM | POA: Diagnosis not present

## 2023-07-10 DIAGNOSIS — D2262 Melanocytic nevi of left upper limb, including shoulder: Secondary | ICD-10-CM | POA: Diagnosis not present

## 2023-07-10 DIAGNOSIS — D485 Neoplasm of uncertain behavior of skin: Secondary | ICD-10-CM | POA: Diagnosis not present

## 2023-07-10 DIAGNOSIS — L900 Lichen sclerosus et atrophicus: Secondary | ICD-10-CM | POA: Diagnosis not present

## 2023-07-10 DIAGNOSIS — D235 Other benign neoplasm of skin of trunk: Secondary | ICD-10-CM | POA: Diagnosis not present

## 2023-08-07 DIAGNOSIS — R21 Rash and other nonspecific skin eruption: Secondary | ICD-10-CM | POA: Diagnosis not present

## 2023-08-08 IMAGING — MG MM DIGITAL SCREENING BILAT W/ TOMO AND CAD
8 series · 8 of 24 positions shown · non-contrast
Comparison: Previous exam(s).

CLINICAL DATA: Screening.

EXAM:
DIGITAL SCREENING BILATERAL MAMMOGRAM WITH TOMOSYNTHESIS AND CAD
TECHNIQUE: Bilateral screening digital craniocaudal and mediolateral oblique
mammograms were obtained. Bilateral screening digital breast
tomosynthesis was performed. The images were evaluated with
computer-aided detection.

[L MLO synth-2D]
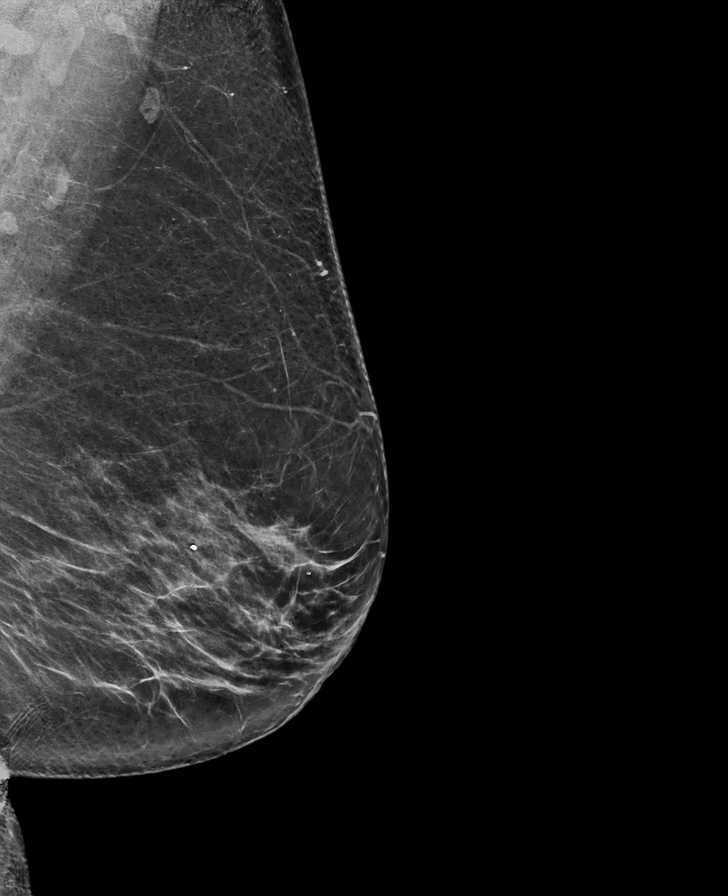

[L CC synth-2D]
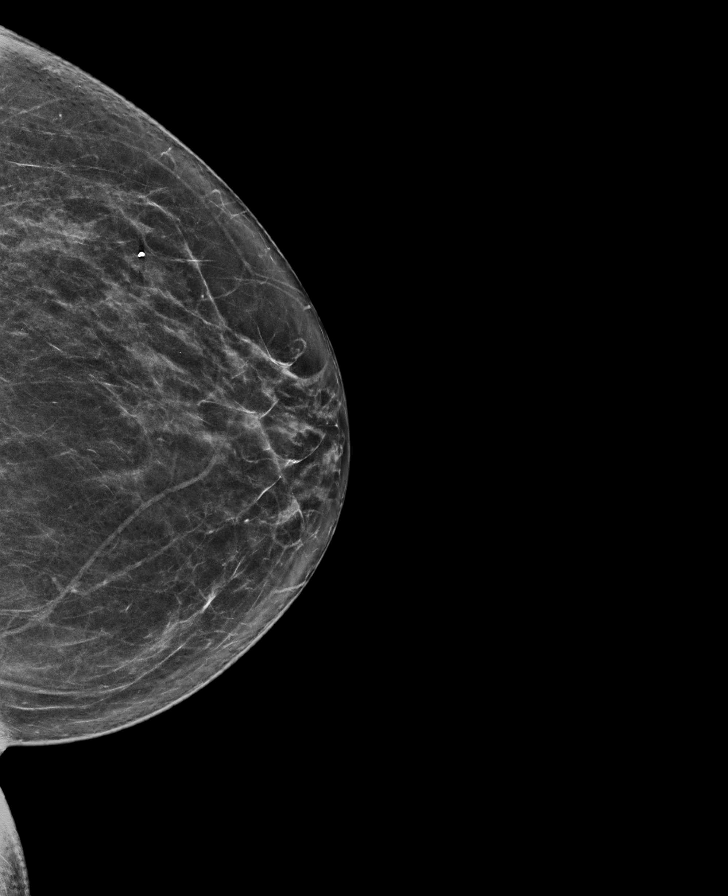

[R MLO synth-2D]
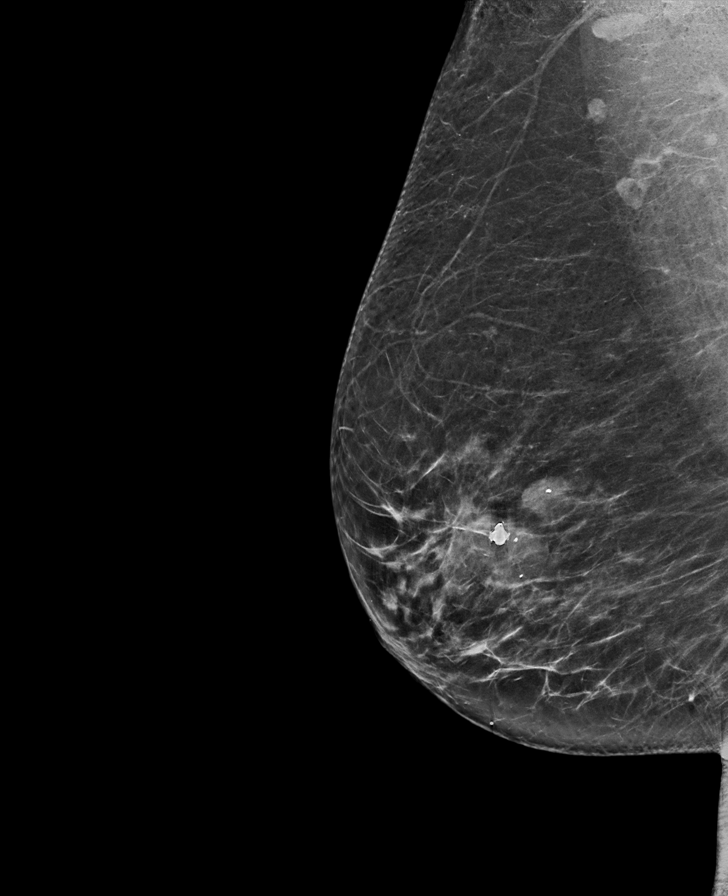

[R CC synth-2D]
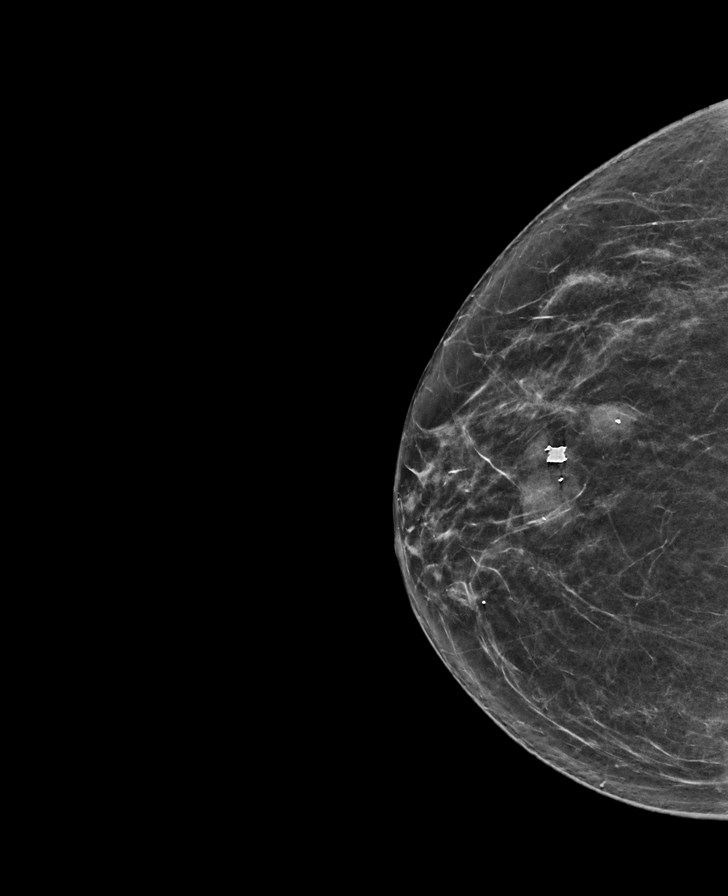

[R CC tomo · tomo slice 33/66.0]
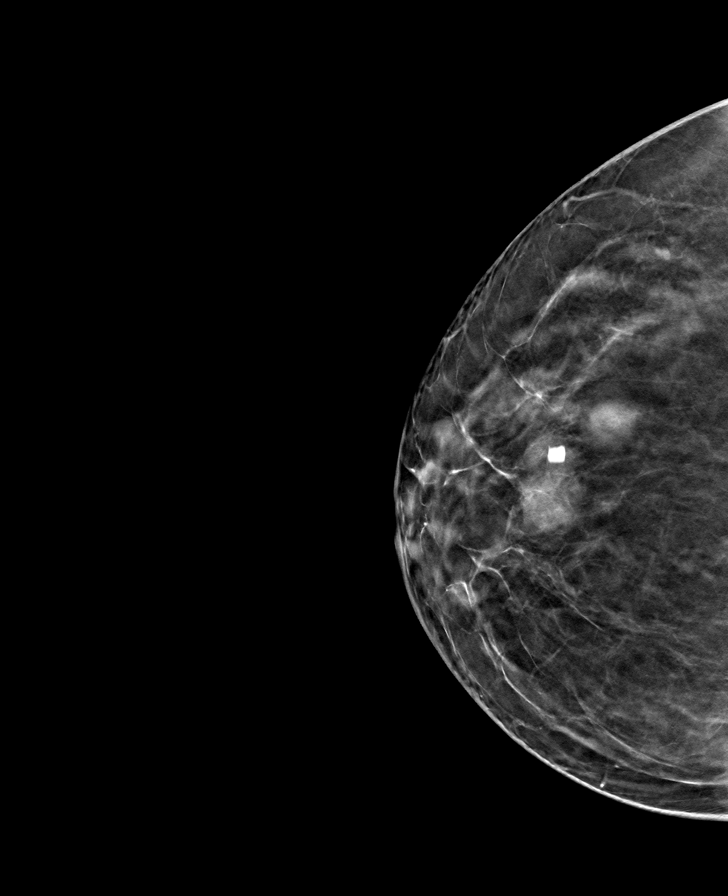

[L CC tomo · tomo slice 36/71.0]
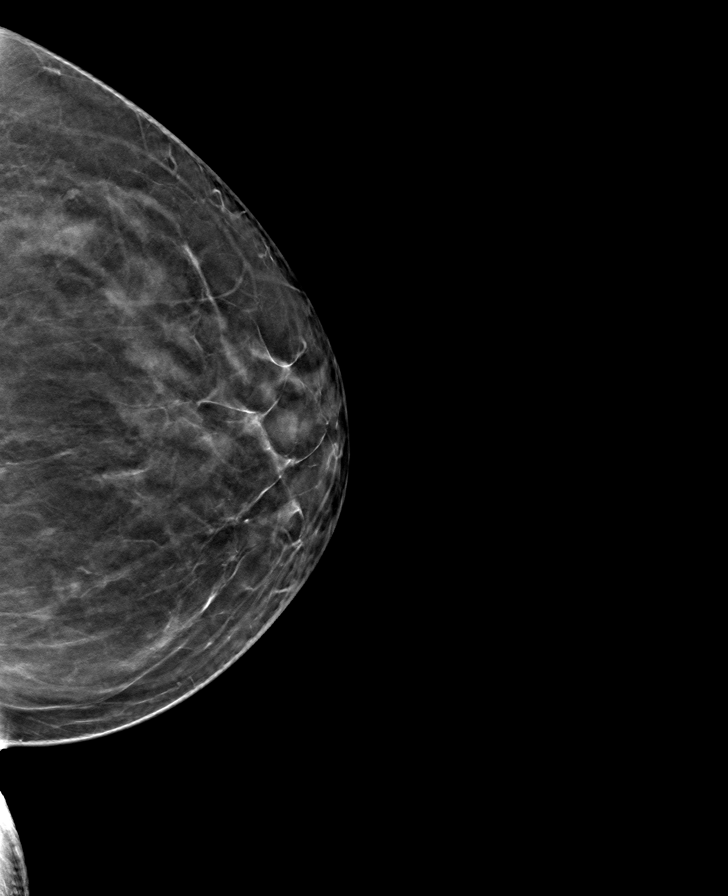

[R MLO tomo · tomo slice 39/77.0]
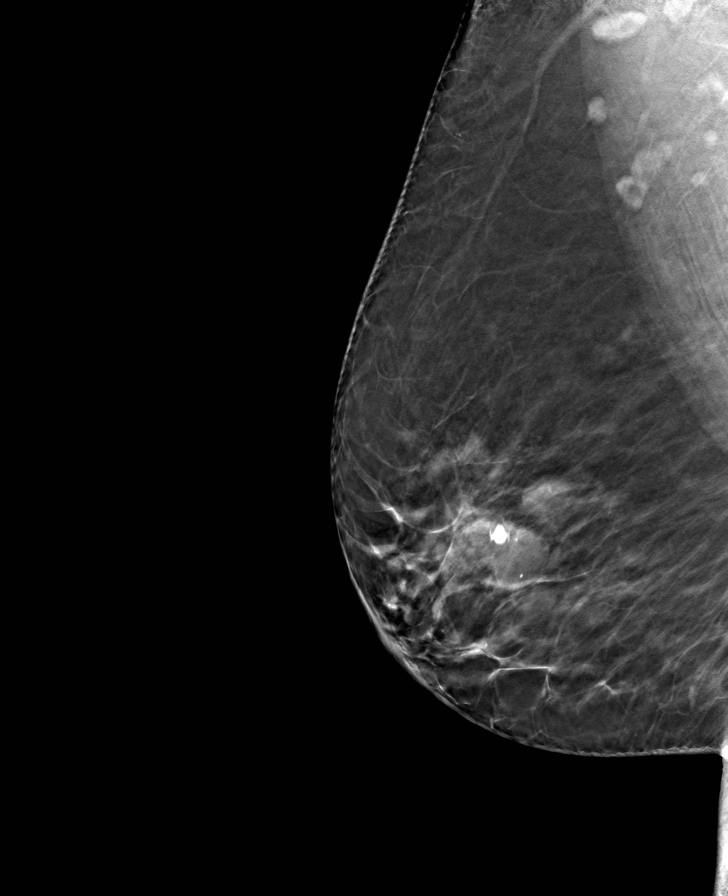

[L MLO tomo · tomo slice 41/81.0]
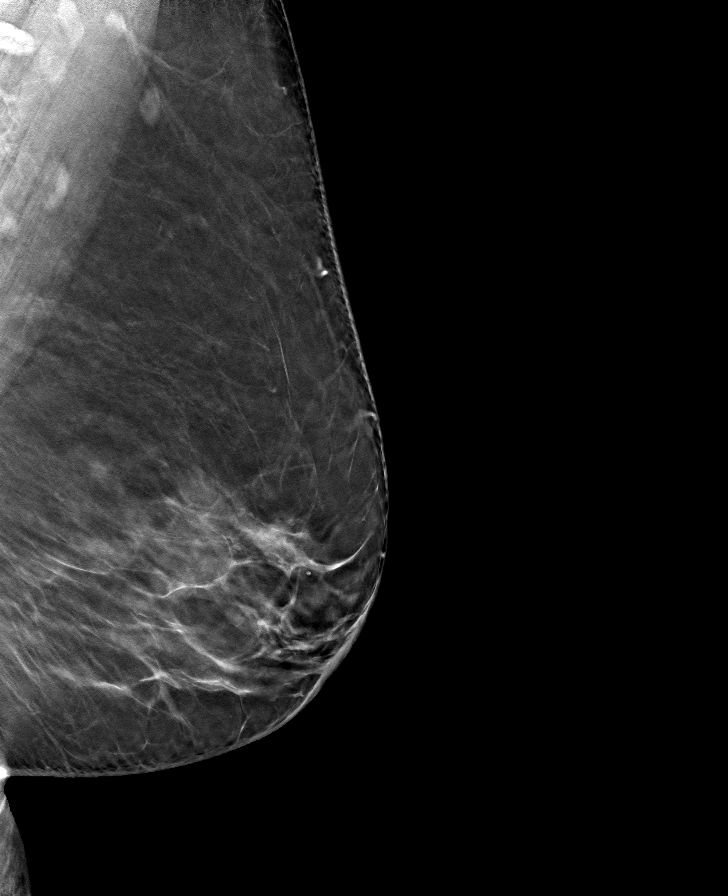

[8 of 24 positions shown; findings below may reference images not displayed]

ACR Breast Density Category b: There are scattered areas of
fibroglandular density.
FINDINGS: There are no findings suspicious for malignancy.
IMPRESSION: No mammographic evidence of malignancy. A result letter of this
screening mammogram will be mailed directly to the patient.

RECOMMENDATION:
Screening mammogram in one year. (Code:51-O-LD2)

BI-RADS CATEGORY  1: Negative.

## 2023-08-21 DIAGNOSIS — I10 Essential (primary) hypertension: Secondary | ICD-10-CM | POA: Diagnosis not present

## 2023-08-21 DIAGNOSIS — E039 Hypothyroidism, unspecified: Secondary | ICD-10-CM | POA: Diagnosis not present

## 2023-08-21 DIAGNOSIS — Z79899 Other long term (current) drug therapy: Secondary | ICD-10-CM | POA: Diagnosis not present

## 2023-08-21 DIAGNOSIS — E7800 Pure hypercholesterolemia, unspecified: Secondary | ICD-10-CM | POA: Diagnosis not present

## 2023-08-21 DIAGNOSIS — R002 Palpitations: Secondary | ICD-10-CM | POA: Diagnosis not present

## 2023-08-28 DIAGNOSIS — Z13820 Encounter for screening for osteoporosis: Secondary | ICD-10-CM | POA: Diagnosis not present

## 2023-08-28 DIAGNOSIS — J4489 Other specified chronic obstructive pulmonary disease: Secondary | ICD-10-CM | POA: Diagnosis not present

## 2023-08-28 DIAGNOSIS — I10 Essential (primary) hypertension: Secondary | ICD-10-CM | POA: Diagnosis not present

## 2023-08-28 DIAGNOSIS — Z79899 Other long term (current) drug therapy: Secondary | ICD-10-CM | POA: Diagnosis not present

## 2023-08-28 DIAGNOSIS — M810 Age-related osteoporosis without current pathological fracture: Secondary | ICD-10-CM | POA: Diagnosis not present

## 2023-08-28 DIAGNOSIS — E039 Hypothyroidism, unspecified: Secondary | ICD-10-CM | POA: Diagnosis not present

## 2023-08-28 DIAGNOSIS — E7800 Pure hypercholesterolemia, unspecified: Secondary | ICD-10-CM | POA: Diagnosis not present

## 2023-08-28 DIAGNOSIS — F320 Major depressive disorder, single episode, mild: Secondary | ICD-10-CM | POA: Diagnosis not present

## 2023-08-31 ENCOUNTER — Other Ambulatory Visit: Payer: Self-pay | Admitting: Internal Medicine

## 2023-08-31 DIAGNOSIS — Z1231 Encounter for screening mammogram for malignant neoplasm of breast: Secondary | ICD-10-CM

## 2023-09-03 DIAGNOSIS — M810 Age-related osteoporosis without current pathological fracture: Secondary | ICD-10-CM | POA: Diagnosis not present

## 2023-09-13 DIAGNOSIS — M79675 Pain in left toe(s): Secondary | ICD-10-CM | POA: Diagnosis not present

## 2023-09-13 DIAGNOSIS — M79674 Pain in right toe(s): Secondary | ICD-10-CM | POA: Diagnosis not present

## 2023-09-13 DIAGNOSIS — B351 Tinea unguium: Secondary | ICD-10-CM | POA: Diagnosis not present

## 2023-09-13 DIAGNOSIS — S90111A Contusion of right great toe without damage to nail, initial encounter: Secondary | ICD-10-CM | POA: Diagnosis not present

## 2023-09-25 DIAGNOSIS — H353131 Nonexudative age-related macular degeneration, bilateral, early dry stage: Secondary | ICD-10-CM | POA: Diagnosis not present

## 2023-11-20 DIAGNOSIS — S40862A Insect bite (nonvenomous) of left upper arm, initial encounter: Secondary | ICD-10-CM | POA: Diagnosis not present

## 2023-11-20 DIAGNOSIS — S40861A Insect bite (nonvenomous) of right upper arm, initial encounter: Secondary | ICD-10-CM | POA: Diagnosis not present

## 2023-11-20 DIAGNOSIS — L820 Inflamed seborrheic keratosis: Secondary | ICD-10-CM | POA: Diagnosis not present

## 2023-11-20 DIAGNOSIS — L538 Other specified erythematous conditions: Secondary | ICD-10-CM | POA: Diagnosis not present

## 2024-02-21 DIAGNOSIS — Z79899 Other long term (current) drug therapy: Secondary | ICD-10-CM | POA: Diagnosis not present

## 2024-02-21 DIAGNOSIS — E7800 Pure hypercholesterolemia, unspecified: Secondary | ICD-10-CM | POA: Diagnosis not present

## 2024-02-21 DIAGNOSIS — I10 Essential (primary) hypertension: Secondary | ICD-10-CM | POA: Diagnosis not present

## 2024-02-21 DIAGNOSIS — E039 Hypothyroidism, unspecified: Secondary | ICD-10-CM | POA: Diagnosis not present

## 2024-02-28 DIAGNOSIS — Z79899 Other long term (current) drug therapy: Secondary | ICD-10-CM | POA: Diagnosis not present

## 2024-02-28 DIAGNOSIS — Z1331 Encounter for screening for depression: Secondary | ICD-10-CM | POA: Diagnosis not present

## 2024-02-28 DIAGNOSIS — Z0000 Encounter for general adult medical examination without abnormal findings: Secondary | ICD-10-CM | POA: Diagnosis not present

## 2024-02-28 DIAGNOSIS — E039 Hypothyroidism, unspecified: Secondary | ICD-10-CM | POA: Diagnosis not present

## 2024-02-28 DIAGNOSIS — J4489 Other specified chronic obstructive pulmonary disease: Secondary | ICD-10-CM | POA: Diagnosis not present

## 2024-02-28 DIAGNOSIS — K219 Gastro-esophageal reflux disease without esophagitis: Secondary | ICD-10-CM | POA: Diagnosis not present

## 2024-02-28 DIAGNOSIS — I10 Essential (primary) hypertension: Secondary | ICD-10-CM | POA: Diagnosis not present

## 2024-02-28 DIAGNOSIS — E7800 Pure hypercholesterolemia, unspecified: Secondary | ICD-10-CM | POA: Diagnosis not present

## 2024-03-06 ENCOUNTER — Ambulatory Visit
Admission: RE | Admit: 2024-03-06 | Discharge: 2024-03-06 | Disposition: A | Discharge status: Home / Self Care | Source: Ambulatory Visit | Attending: Internal Medicine | Admitting: Internal Medicine

## 2024-03-06 DIAGNOSIS — Z1231 Encounter for screening mammogram for malignant neoplasm of breast: Secondary | ICD-10-CM | POA: Diagnosis not present

## 2024-03-28 NOTE — Progress Notes (Signed)
 Karen Moon                                          MRN: [id number]   03/28/2024   The [facility] Quality Team Specialist reviewed this patient medical record for the purposes of chart review for care gap closure. The following were reviewed: abstraction for care gap closure-controlling blood pressure.    [facility] Quality Team

## 2024-04-10 DIAGNOSIS — H353131 Nonexudative age-related macular degeneration, bilateral, early dry stage: Secondary | ICD-10-CM | POA: Diagnosis not present

## 2024-04-29 ENCOUNTER — Ambulatory Visit: Payer: Self-pay | Admitting: Podiatry

## 2024-05-13 ENCOUNTER — Ambulatory Visit: Payer: Self-pay | Admitting: Podiatry

## 2024-05-13 DIAGNOSIS — B351 Tinea unguium: Secondary | ICD-10-CM | POA: Diagnosis not present

## 2024-05-13 NOTE — Progress Notes (Signed)
 "  Chief Complaint  Patient presents with   Nail Problem    New pt-issues with right great toenail since the summer. The 3rd toenail on the left foot is very smal.  was seeing Dr Ashley     HPI: 79 y.o. femalepresenting for evaluation of thick discoloration to the right hallux nail plate as well as the left fourth toe.  She has been seen by Dr. Ashley at [facility] who prescribed ciclopirox topical.  She has been applying it for approximately 4 months now.  Presenting for second opinion  Past Medical History:  Diagnosis Date   Allergic rhinitis    Benign neoplasm of ascending colon    Common cold    COPD (chronic obstructive pulmonary disease) (HCC)    Depression    Dysplastic nevus 05/09/2018   Mild atypia, deep margin involved. Left chest.   GERD (gastroesophageal reflux disease)    Headache    Hyperlipemia    Hypertension    Hypothyroidism    Insomnia    Osteoarthritis    Osteoporosis    Seizure (HCC)    Vaginal dryness, menopausal    Vaginal vault prolapse    ANTERIOR    Past Surgical History:  Procedure Laterality Date   CATARACT EXTRACTION W/ INTRAOCULAR LENS IMPLANT Left 05/09/2011   COLONOSCOPY WITH PROPOFOL  N/A 01/26/2015   Procedure: COLONOSCOPY WITH PROPOFOL ;  Surgeon: Rogelia Copping, MD;  Location: [facility] ENDOSCOPY;  Service: Endoscopy;  Laterality: N/A;   COLONOSCOPY WITH PROPOFOL  N/A 03/13/2018   Procedure: COLONOSCOPY WITH PROPOFOL ;  Surgeon: Viktoria Lamar DASEN, MD;  Location: [facility] ENDOSCOPY;  Service: Endoscopy;  Laterality: N/A;   COLONOSCOPY WITH PROPOFOL  N/A 06/07/2023   Procedure: COLONOSCOPY WITH PROPOFOL ;  Surgeon: Onita Elspeth Sharper, DO;  Location: [facility] ENDOSCOPY;  Service: Gastroenterology;  Laterality: N/A;   CYSTOSCOPY N/A 07/15/2012   Procedure: CYSTOSCOPY;  Surgeon: Arlena LILLETTE Gal, MD;  Location: [facility];  Service: Urology;  Laterality: N/A;   ESOPHAGOGASTRODUODENOSCOPY (EGD) WITH PROPOFOL  N/A 03/13/2018   Procedure:  ESOPHAGOGASTRODUODENOSCOPY (EGD) WITH PROPOFOL ;  Surgeon: Viktoria Lamar DASEN, MD;  Location: [facility] ENDOSCOPY;  Service: Endoscopy;  Laterality: N/A;   EYE SURGERY     MILK GLAND EXCISED     MULTIPLE TOOTH EXTRACTIONS  05/08/2002   ORAL MD OFFICE   POLYPECTOMY  06/07/2023   Procedure: POLYPECTOMY;  Surgeon: Onita Elspeth Sharper, DO;  Location: [facility] ENDOSCOPY;  Service: Gastroenterology;;   PUBOVAGINAL SLING N/A 07/15/2012   Procedure: CARLOYN GLADE;  Surgeon: Arlena LILLETTE Gal, MD;  Location: [facility];  Service: Urology;  Laterality: N/A;  UPHOLD LITE ANTERIOR VAULT REPAIR SOLYX SLING    Allergies[1]   Physical Exam: General: The patient is alert and oriented x3 in no acute distress.  Dermatology: Skin is warm, dry and supple bilateral lower extremities.  Thickening noted to the right hallux nail plate as well as the left fourth toenail consistent with possible toenail fungus.  No erythema or concern clinically for acute bacterial infection or paronychia  Vascular: Palpable pedal pulses bilaterally. Capillary refill within normal limits.  No appreciable edema.  No erythema.  Neurological: Grossly intact via light touch  Musculoskeletal Exam: No pedal deformities noted   Assessment/Plan of Care: 1.  Fungal nail infection right hallux nail plate and left fourth toenail  -Light debridement of the toenails was performed today using a nail nipper without incident or bleeding - Continue ciclopirox topical that was prescribed by Dr. Ashley at [place] [facility] -Maintain good foot hygiene -  Return to clinic PRN     Thresa EMERSON Sar, DPM [facility]  Dr. Thresa EMERSON Sar, DPM    [address] [place], [place] [address]                Office [phone]  Fax [phone]        [1]  Allergies Allergen Reactions   Pantoprazole    "
